# Patient Record
Sex: Male | Born: 1945 | Race: White | Hispanic: No | Marital: Married | Smoking: Never smoker
Health system: Southern US, Community
[De-identification: ages and names within clinical notes are randomized; demographics above are authoritative.]

## PROBLEM LIST (undated history)

## (undated) DIAGNOSIS — E785 Hyperlipidemia, unspecified: Secondary | ICD-10-CM

## (undated) DIAGNOSIS — M542 Cervicalgia: Secondary | ICD-10-CM

## (undated) DIAGNOSIS — I10 Essential (primary) hypertension: Secondary | ICD-10-CM

## (undated) DIAGNOSIS — F32A Depression, unspecified: Secondary | ICD-10-CM

## (undated) DIAGNOSIS — F419 Anxiety disorder, unspecified: Secondary | ICD-10-CM

## (undated) DIAGNOSIS — N400 Enlarged prostate without lower urinary tract symptoms: Secondary | ICD-10-CM

## (undated) DIAGNOSIS — F329 Major depressive disorder, single episode, unspecified: Secondary | ICD-10-CM

## (undated) DIAGNOSIS — G709 Myoneural disorder, unspecified: Secondary | ICD-10-CM

## (undated) DIAGNOSIS — G8929 Other chronic pain: Secondary | ICD-10-CM

## (undated) HISTORY — DX: Cervicalgia: M54.2

## (undated) HISTORY — PX: HERNIA REPAIR: SHX51

## (undated) HISTORY — PX: SPINE SURGERY: SHX786

## (undated) HISTORY — DX: Major depressive disorder, single episode, unspecified: F32.9

## (undated) HISTORY — DX: Hyperlipidemia, unspecified: E78.5

## (undated) HISTORY — DX: Depression, unspecified: F32.A

## (undated) HISTORY — DX: Anxiety disorder, unspecified: F41.9

## (undated) HISTORY — DX: Myoneural disorder, unspecified: G70.9

## (undated) HISTORY — DX: Benign prostatic hyperplasia without lower urinary tract symptoms: N40.0

## (undated) HISTORY — DX: Essential (primary) hypertension: I10

## (undated) HISTORY — DX: Other chronic pain: G89.29

---

## 2003-03-02 ENCOUNTER — Ambulatory Visit (HOSPITAL_COMMUNITY): Admission: RE | Admit: 2003-03-02 | Discharge: 2003-03-02 | Payer: Self-pay | Admitting: Gastroenterology

## 2005-10-19 ENCOUNTER — Emergency Department: Payer: Self-pay | Admitting: Emergency Medicine

## 2005-10-23 ENCOUNTER — Ambulatory Visit: Payer: Self-pay | Admitting: Specialist

## 2005-11-04 ENCOUNTER — Inpatient Hospital Stay (HOSPITAL_COMMUNITY): Admission: RE | Admit: 2005-11-04 | Discharge: 2005-11-10 | Payer: Self-pay | Admitting: Neurosurgery

## 2008-08-20 ENCOUNTER — Encounter: Admission: RE | Admit: 2008-08-20 | Discharge: 2008-08-20 | Payer: Self-pay | Admitting: Family Medicine

## 2009-12-11 ENCOUNTER — Ambulatory Visit: Payer: Self-pay | Admitting: Surgery

## 2009-12-18 ENCOUNTER — Ambulatory Visit: Payer: Self-pay | Admitting: Surgery

## 2010-08-28 ENCOUNTER — Encounter: Admission: RE | Admit: 2010-08-28 | Discharge: 2010-08-28 | Payer: Self-pay | Admitting: Family Medicine

## 2010-09-30 ENCOUNTER — Encounter: Admission: RE | Admit: 2010-09-30 | Discharge: 2010-09-30 | Payer: Self-pay | Admitting: Neurosurgery

## 2010-11-05 ENCOUNTER — Other Ambulatory Visit: Payer: Self-pay | Admitting: Internal Medicine

## 2010-11-11 ENCOUNTER — Ambulatory Visit: Payer: Self-pay | Admitting: Pain Medicine

## 2010-11-26 ENCOUNTER — Ambulatory Visit: Payer: Self-pay | Admitting: Pain Medicine

## 2010-12-02 ENCOUNTER — Ambulatory Visit: Payer: Self-pay | Admitting: Pain Medicine

## 2010-12-16 ENCOUNTER — Ambulatory Visit: Payer: Self-pay | Admitting: Pain Medicine

## 2010-12-29 ENCOUNTER — Ambulatory Visit: Payer: Self-pay | Admitting: Pain Medicine

## 2011-02-11 ENCOUNTER — Other Ambulatory Visit: Payer: Self-pay | Admitting: Internal Medicine

## 2011-03-20 NOTE — Op Note (Signed)
Austin Rodriguez, KARGE               ACCOUNT NO.:  [id number]   MEDICAL RECORD NO.:  [id number]          PATIENT TYPE:  INP   LOCATION:  2899                         FACILITY:  [facility]   PHYSICIAN:  [name], M.D.   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   PROCEDURE:  Bilateral L3-L4 laminectomy, foraminotomy, removal of synovial  cyst at the level of L3-L4, left.   SURGEON:  [name], M.D.   ASSISTANT:  Clydene Fake, M.D.   CLINICAL HISTORY:  The patient was admitted because of back pain with  radiation to both legs.  He has severe stenosis at the level of L3-L4 and L4-  L5.  The patient has quite a bit of weakness in both legs with difficulty  urinating up to the point he had to sit to void.  X-rays showed severe  stenosis at L3-L4 and L4-L5.  Surgery was advised and the risks were  explained during the history and physical.   DESCRIPTION OF PROCEDURE:  The patient was taken to the OR and after  intubation, he was positioned in a prone manner.  The back was prepped with  Betadine.  A midline incision from L2-L3 was made all the way down to L5-S1.  The muscles were retracted laterally.  X-ray showed that, indeed, the upper  clamp was at the level of L3.  Then, with the Stille rongeur, we removed the  spinous process of L3, L4, and L5.  With the drill, we did the laminectomy  to decompress and remove the laminae of L3, L4, and L5.  Indeed, the patient  had quite a bit of thickening of the yellow ligament and foraminotomy was  accomplished.  We went laterally preserving the facet.  At the level of L3-  L4 to the left, the patient has quite a bit of large synovial cysts which  were adherent to the  dural matter.  Removal was accomplished.  There was a  small arachnoid pouch which was repaired with Prolene as well as Tisseel.  Having done this, the area was irrigated.  We investigated, again, the  foramen and they were open.  Valsalva maneuver was negative.  From then on,  the wound was closed with Vicryl and Steri-Strips.           ______________________________  [name], M.D.     EB/MEDQ  D:  11/04/2005  T:  11/04/2005  Job:  [id number]

## 2011-03-20 NOTE — Consult Note (Signed)
NAMEBOHDI, LEEDS               ACCOUNT NO.:  [id number]   MEDICAL RECORD NO.:  [id number]          PATIENT TYPE:  INP   LOCATION:  3034                         FACILITY:  [facility]   PHYSICIAN:  Sigmund I. Patsi Sears, M.D.DATE OF BIRTH:  February 11, 1946   DATE OF CONSULTATION:  11/09/2005  DATE OF DISCHARGE:                                   CONSULTATION   The time is 2000 hours.   SUBJECTIVE:  Austin Rodriguez is a 65 year old married, white male, status post  spinal stenosis surgery with lumbar laminectomy L3-L4, foraminotomy, removal  of synovial cyst at L3-L4, on November 04, 2005.  The patient has had urinary  retention since this surgery.   PAST MEDICAL HISTORY:  Significant for two year history of lower urinary  tract symptoms (blood tinged) with slow urinary stream, and urinary  intermittency.  He denies nocturia, frequency or urgency.   His past history is significant for left inguinal hernia, with history of  urinary retention until hernia reduced.   PAST MEDICAL HISTORY:  <ROS:  Noncontributory.  There is no history of heart disease, diabetes, COPD.  Urologic ZOX:WRUEAVW retention with hernia repair. Otherwise neg.  <FAMILY HISTORY: No history urinary problems.   HOSPITAL COURSE:  The patient has had a Foley catheter removed 3 different  times, each time with urinary retention.  Today, the patient had voided well  in the morning, but then had 500 cc residual this afternoon.  A Foley  catheter was re-inserted with 700 cc removed.   PHYSICAL EXAMINATION:  GENERAL:  Today shows a well-developed white male in  no acute distress.  VITAL SIGNS:  Blood pressure today is 120/73.  Temperature 97.3.  Pulse is  84.  CHEST: Clear to P and A.  COR: S1S2 nml.  ABDOMEN:  Soft, scaphoid, plus bowel sounds without organomegaly, without  masses.  GENITOURINARY:  Foley catheter placed.  Penis is normal.  Urethra is normal.  The glans are normal.  The testicles are descended bilaterally and  measures  4 x 4 cm.  RECTAL:  Shows decrease in sphincter tone.  Prostate is 3+, nodules benign.  There is left inguinal hernia present.   ASSESSMENT:  Urinary retention, recurrent following back surgery with 2 year  history of bladder outlet obstruction symptoms.   PLAN:  Double the Flomax to 0.4 mg b.i.d. as well as Urecholine 27 mg b.i.d.  We will begin that tonight.  Tomorrow morning, we will remove Foley catheter  at 6 a.m., give voiding trial with PPR tomorrow.  Anticipate that the  patient will be able to recover voiding function.      Sigmund I. Patsi Sears, M.D.  Electronically Signed     SIT/MEDQ  D:  11/09/2005  T:  11/10/2005  Job:  [id number]

## 2011-03-20 NOTE — Discharge Summary (Signed)
NAMEDERRIEN, ANSCHUTZ               ACCOUNT NO.:  [id number]   MEDICAL RECORD NO.:  [id number]          PATIENT TYPE:  INP   LOCATION:  3034                         FACILITY:  [facility]   PHYSICIAN:  [name], M.D.   DATE OF BIRTH:  Jul 24, 1946   DATE OF ADMISSION:  11/04/2005  DATE OF DISCHARGE:  11/10/2005                                 DISCHARGE SUMMARY   ADMISSION DIAGNOSIS:  Lumbar stenosis with degenerative disk disease.   FINAL DIAGNOSIS:  Lumbar stenosis with degenerative disk disease.   CLINICAL HISTORY:  The patient was admitted because of back pain that  radiates to both legs.  X-rays showed degenerative disk disease in the  lumbar spine.  Surgery was advised.   LABORATORY:  Normal.   COURSE IN THE HOSPITAL:  The patient was taken to surgery and a bilateral 3,  4 and 5 laminectomy, and foraminotomy was accomplished.  A large synovial  cyst was also removed from the left L3-L4.  After surgery, the patient  developed urine retention.  He was seen by Dr. Patsi Sears.  He was started  on Flomax.  On the night after surgery, he was feeling better and he was  discharged.   CONDITION ON DISCHARGE:  Improvement.   MEDICATIONS:  Percocet and lorazepam, Flomax and Urecholine.   DIET:  Regular.   ACTIVITY:  Not to drive for at least a week.   FOLLOW UP:  He will be seen by Dr. Patsi Sears, as well by myself in the  office in 4 weeks.           ______________________________  [name], M.D.     EB/MEDQ  D:  04/06/2006  T:  04/06/2006  Job:  [id number]

## 2011-03-20 NOTE — Op Note (Signed)
   NAME:  Austin Rodriguez, Austin Rodriguez                         ACCOUNT NO.:  [id number]   MEDICAL RECORD NO.:  [id number]                   PATIENT TYPE:  AMB   LOCATION:  ENDO                                 FACILITY:  [facility]   PHYSICIAN:  Anselmo Rod, M.D.               DATE OF BIRTH:  1946/02/22   DATE OF PROCEDURE:  03/02/2003  DATE OF DISCHARGE:                                 OPERATIVE REPORT   PROCEDURE PERFORMED:  Screening colonoscopy.   ENDOSCOPIST:  Charna Elizabeth, M.D.   INSTRUMENT USED:  Olympus video colonoscope.   INDICATIONS FOR PROCEDURE:  The patient is a 65 year old white male  undergoing screening colonoscopy.  Rule out colonic polyps, masses, etc.   PREPROCEDURE PREPARATION:  Informed consent was procured from the patient.  The patient was fasted for eight hours prior to the procedure and prepped  with a bottle of magnesium citrate and a gallon of GoLYTELY the night prior  to the procedure.   PREPROCEDURE PHYSICAL:  The patient had stable vital signs.  Neck supple.  Chest clear to auscultation.  S1 and S2 regular.  Abdomen soft with normal  bowel sounds.   DESCRIPTION OF PROCEDURE:  The patient was placed in left lateral decubitus  position and sedated with 60 mg of Demerol and 6 mg of Versed intravenously.  Once the patient was adequately sedated and maintained on low flow oxygen  and continuous cardiac monitoring, the Olympus video colonoscope was  advanced from the rectum to the cecum without difficulty.  The prep was  adequate, no masses polyps, erosions, ulcerations or diverticula were seen.  The appendicular orifice and ileocecal valve were clearly visualized and  photographed.  On withdrawal of the scope, small internal hemorrhoids were  seen on retroflexion and small external hemorrhoids were seen on anal  inspection.  The patient tolerated the procedure well without complication.   IMPRESSION:  Normal colonoscopy except for small nonbleeding internal and  external hemorrhoids.                   RECOMMENDATIONS:  1. A high fiber diet with liberal fluid intake has been advocated.  2. Repeat colorectal cancer screening is recommended in the next 10 years     unless the patient develops any abnormal symptoms in the interim.  3. Outpatient follow-up on a p.r.n. basis.                                                  Anselmo Rod, M.D.  JNM/MEDQ  D:  03/02/2003  T:  03/02/2003  Job:  [id number]   cc:   Talmadge Coventry, M.D.  [address], [address]  [place]  [place] [address]  Fax: [phone]

## 2011-05-04 ENCOUNTER — Other Ambulatory Visit: Payer: Self-pay | Admitting: Internal Medicine

## 2011-07-09 ENCOUNTER — Other Ambulatory Visit: Payer: Self-pay | Admitting: Internal Medicine

## 2011-07-10 MED ORDER — FEXOFENADINE HCL 180 MG PO TABS: 180.0000 mg | ORAL_TABLET | Freq: Every day | ORAL | Status: DC

## 2011-07-30 ENCOUNTER — Other Ambulatory Visit: Payer: Self-pay | Admitting: Internal Medicine

## 2011-08-03 ENCOUNTER — Other Ambulatory Visit: Payer: Self-pay | Admitting: Internal Medicine

## 2011-08-03 MED ORDER — SIMVASTATIN 20 MG PO TABS: 20.0000 mg | ORAL_TABLET | Freq: Every evening | ORAL | Status: DC

## 2011-08-10 ENCOUNTER — Other Ambulatory Visit: Payer: Self-pay | Admitting: Internal Medicine

## 2011-08-10 NOTE — Telephone Encounter (Signed)
Patient needs a refill on his percocet..

## 2011-08-11 ENCOUNTER — Other Ambulatory Visit: Payer: Self-pay | Admitting: Internal Medicine

## 2011-08-11 MED ORDER — OXYCODONE-ACETAMINOPHEN 10-325 MG PO TABS: 1.0000 | ORAL_TABLET | Freq: Four times a day (QID) | ORAL | Status: AC | PRN

## 2011-08-11 MED ORDER — OXYCODONE-ACETAMINOPHEN 10-325 MG PO TABS: 1.0000 | ORAL_TABLET | Freq: Four times a day (QID) | ORAL | Status: DC | PRN

## 2011-08-13 ENCOUNTER — Other Ambulatory Visit: Payer: Self-pay | Admitting: Internal Medicine

## 2011-08-31 ENCOUNTER — Encounter: Payer: Self-pay | Admitting: Internal Medicine

## 2011-08-31 ENCOUNTER — Ambulatory Visit (INDEPENDENT_AMBULATORY_CARE_PROVIDER_SITE_OTHER): Payer: 59 | Admitting: Internal Medicine

## 2011-08-31 DIAGNOSIS — M5440 Lumbago with sciatica, unspecified side: Secondary | ICD-10-CM | POA: Insufficient documentation

## 2011-08-31 DIAGNOSIS — Z125 Encounter for screening for malignant neoplasm of prostate: Secondary | ICD-10-CM | POA: Insufficient documentation

## 2011-08-31 DIAGNOSIS — M5126 Other intervertebral disc displacement, lumbar region: Secondary | ICD-10-CM

## 2011-08-31 DIAGNOSIS — K635 Polyp of colon: Secondary | ICD-10-CM | POA: Insufficient documentation

## 2011-08-31 DIAGNOSIS — E119 Type 2 diabetes mellitus without complications: Secondary | ICD-10-CM | POA: Insufficient documentation

## 2011-08-31 DIAGNOSIS — M542 Cervicalgia: Secondary | ICD-10-CM | POA: Insufficient documentation

## 2011-08-31 DIAGNOSIS — Z1211 Encounter for screening for malignant neoplasm of colon: Secondary | ICD-10-CM

## 2011-08-31 MED ORDER — OXYCODONE-ACETAMINOPHEN 10-325 MG PO TABS: 1.0000 | ORAL_TABLET | Freq: Four times a day (QID) | ORAL | Status: DC | PRN

## 2011-08-31 NOTE — Progress Notes (Signed)
Subjective:    Patient ID: Austin Rodriguez, male    DOB: 03/10/1946, 65 y.o.   MRN: [id number]  HPI  65 yo white male with history of diabetes mellitus, cervical stenosis managed with neurontin and percocet , is here for an annual exam. He has no new complaints.  His pain is well managed with percocet and neurontin.  He has numbness in two fingers of his right hand but no weakness.  His blood sugars are well controlled .  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   Past Medical History  Diagnosis Date  . Diabetes mellitus   . cervical spondylosis     Current Outpatient Prescriptions on File Prior to Visit  Medication Sig Dispense Refill  . CYMBALTA 60 MG capsule TAKE ONE CAPSULE EVERY DAY  90 capsule  0  . fexofenadine (ALLEGRA) 180 MG tablet Take 1 tablet (180 mg total) by mouth daily.  30 tablet  3  . simvastatin (ZOCOR) 20 MG tablet Take 1 tablet (20 mg total) by mouth every evening.  90 tablet  3     Review of Systems  Constitutional: Negative for fever, chills, diaphoresis, activity change, appetite change, fatigue and unexpected weight change.  HENT: Positive for sneezing. Negative for hearing loss, ear pain, nosebleeds, congestion, sore throat, facial swelling, rhinorrhea, drooling, mouth sores, trouble swallowing, neck pain, neck stiffness, dental problem, voice change, postnasal drip, sinus pressure, tinnitus and ear discharge.   Eyes: Negative for photophobia, pain, discharge, redness, itching and visual disturbance.  Respiratory: Negative for apnea, cough, choking, chest tightness, shortness of breath, wheezing and stridor.   Cardiovascular: Negative for chest pain, palpitations and leg swelling.  Gastrointestinal: Negative for nausea, vomiting, abdominal pain, diarrhea, constipation, blood in stool, abdominal distention, anal bleeding and rectal pain.  Genitourinary: Negative for dysuria, urgency, frequency, hematuria, flank pain, decreased urine volume,  scrotal swelling, difficulty urinating and testicular pain.  Musculoskeletal: Positive for back pain and arthralgias. Negative for myalgias, joint swelling and gait problem.  Skin: Negative for color change, rash and wound.  Neurological: Negative for dizziness, tremors, seizures, syncope, speech difficulty, weakness, light-headedness, numbness and headaches.  Psychiatric/Behavioral: Negative for suicidal ideas, hallucinations, behavioral problems, confusion, sleep disturbance, dysphoric mood, decreased concentration and agitation. The patient is not nervous/anxious.   All other systems reviewed and are negative.   BP 134/80  Pulse 78  Temp(Src) 97.8 F (36.6 C) (Oral)  Resp 16  Ht 6' (1.829 m)  Wt 240 lb 8 oz (109.09 kg)  BMI 32.62 kg/m2  SpO2 97%     Objective:   Physical Exam  Constitutional: He is oriented to person, place, and time.  HENT:  Head: Normocephalic and atraumatic.  Mouth/Throat: Oropharynx is clear and moist.  Eyes: Conjunctivae and EOM are normal.  Neck: Normal range of motion. Neck supple. No JVD present. No thyromegaly present.  Cardiovascular: Normal rate, regular rhythm and normal heart sounds.   Pulmonary/Chest: Effort normal and breath sounds normal. He has no wheezes. He has no rales.  Abdominal: Soft. Bowel sounds are normal. He exhibits no mass. There is no tenderness. There is no rebound.  Genitourinary: Rectum normal, prostate normal and penis normal.  Musculoskeletal: Normal range of motion. He exhibits no edema.  Neurological: He is alert and oriented to person, place, and time.  Skin: Skin is warm and dry.  Psychiatric: He has a normal mood and affect. His behavior is normal. Thought content normal.  Assessment & Plan:  Annual male exam:  Prostate and testicular exam were normal.  PSA was done last week at [facility] for prostate cancer screening.  DM: historically well controlled, repeat A1c is due.   Hyperlipidemia: managed with  statin.  LDFL gos is 70,  Repeat liids due.  Colon Cancer screening:  prior colonoscopy was done by Charna Elizabeth and normal.

## 2011-08-31 NOTE — Patient Instructions (Addendum)
We will check your fasting labs next week  For constipation, use Dulcolax or Sennot S

## 2011-09-02 ENCOUNTER — Other Ambulatory Visit: Payer: Self-pay | Admitting: Internal Medicine

## 2011-09-04 MED ORDER — ALPRAZOLAM 0.5 MG PO TABS: 0.5000 mg | ORAL_TABLET | Freq: Every day | ORAL | Status: DC | PRN

## 2011-09-21 ENCOUNTER — Telehealth: Payer: Self-pay | Admitting: Internal Medicine

## 2011-09-21 NOTE — Telephone Encounter (Signed)
Notified patient of results 

## 2011-09-21 NOTE — Telephone Encounter (Signed)
His PSA from early October was normal at 0.8

## 2011-09-22 ENCOUNTER — Other Ambulatory Visit: Payer: Self-pay | Admitting: Internal Medicine

## 2011-10-05 ENCOUNTER — Telehealth: Payer: Self-pay | Admitting: Internal Medicine

## 2011-10-05 NOTE — Telephone Encounter (Signed)
He needs a CMET, UA and HgbA1c.  Can he come by here today to get these drawn?

## 2011-10-05 NOTE — Telephone Encounter (Signed)
Patients wife called and stated he just had labs done last week at the hospital.  I will request labs from hospital.

## 2011-10-05 NOTE — Telephone Encounter (Signed)
Patients wife called and stated patients blood sugars have been high.  Last night they were 578 and checked it again and it was 575.  She stated last night he had not eat anything since lunch that day, except for some popcorn.  She said this morning fasting it was 274.  He is not on any medication or insulin, she said he has been controlling it with diet.  She said he has lost weight over the past few weeks.  He has an appt for next week but I moved it up to tomorrow.  She wanted to know if there is anything that needs be done before the appt tomorrow.  Please advise.

## 2011-10-06 ENCOUNTER — Encounter: Payer: Self-pay | Admitting: Internal Medicine

## 2011-10-06 ENCOUNTER — Ambulatory Visit (INDEPENDENT_AMBULATORY_CARE_PROVIDER_SITE_OTHER): Payer: 59 | Admitting: Internal Medicine

## 2011-10-06 DIAGNOSIS — E119 Type 2 diabetes mellitus without complications: Secondary | ICD-10-CM

## 2011-10-06 DIAGNOSIS — E1165 Type 2 diabetes mellitus with hyperglycemia: Secondary | ICD-10-CM

## 2011-10-06 DIAGNOSIS — R350 Frequency of micturition: Secondary | ICD-10-CM

## 2011-10-06 LAB — POCT URINALYSIS DIPSTICK
Glucose, UA: 100
Leukocytes, UA: NEGATIVE
Spec Grav, UA: 1.025
Urobilinogen, UA: 1

## 2011-10-06 MED ORDER — GLIPIZIDE 5 MG PO TABS: 5.0000 mg | ORAL_TABLET | Freq: Two times a day (BID) | ORAL | Status: DC

## 2011-10-06 MED ORDER — METFORMIN HCL 500 MG PO TABS: 500.0000 mg | ORAL_TABLET | Freq: Two times a day (BID) | ORAL | Status: DC

## 2011-10-06 NOTE — Progress Notes (Signed)
Subjective:    Patient ID: Austin Rodriguez, male    DOB: 04/14/1946, 65 y.o.   MRN: [id number]  HPI  65 yo diet controlled dm, last a1c 6.2 in October,  Presents with one week history of malaise, dipahoresis, with recent dietary excursions due to the holidays.   Elevated blood sugar of 574 last Sunday,   Polyuria,  10 lb wt loss, noted since last visit 5 weeks ago.  cbgs for the past 3 days 274 fasting 289 post prandial.  Now on low cab diet.  No new meds and no herbal meds  .  Current Outpatient Prescriptions on File Prior to Visit  Medication Sig Dispense Refill  . acetaminophen (TYLENOL) 500 MG tablet Take 500 mg by mouth every 6 (six) hours as needed.        . ALPRAZolam (XANAX) 0.5 MG tablet Take 1 tablet (0.5 mg total) by mouth daily as needed for sleep or anxiety.  30 tablet  3  . aspirin 81 MG tablet Take 81 mg by mouth daily.        . cholecalciferol (VITAMIN D) 1000 UNITS tablet Take 1,000 Units by mouth daily.        . CYMBALTA 60 MG capsule TAKE ONE CAPSULE EVERY DAY  90 capsule  0  . fexofenadine (ALLEGRA) 180 MG tablet Take 1 tablet (180 mg total) by mouth daily.  30 tablet  3  . fish oil-omega-3 fatty acids 1000 MG capsule Take 2 g by mouth daily.        . fluticasone (FLONASE) 50 MCG/ACT nasal spray Place 2 sprays into the nose daily.        Marland Kitchen gabapentin (NEURONTIN) 300 MG capsule Take 600 mg by mouth 3 (three) times daily.        Marland Kitchen losartan-hydrochlorothiazide (HYZAAR) 100-12.5 MG per tablet Take 1 tablet by mouth daily.        . Multiple Vitamin (MULTIVITAMIN) tablet Take 1 tablet by mouth daily.        Marland Kitchen oxyCODONE-acetaminophen (PERCOCET) 10-325 MG per tablet Take 1 tablet by mouth every 6 (six) hours as needed.  120 tablet  0  . simvastatin (ZOCOR) 20 MG tablet Take 1 tablet (20 mg total) by mouth every evening.  90 tablet  3   Past Medical History  Diagnosis Date  . Diabetes mellitus   . cervical spondylosis        Review of Systems  Constitutional: Negative for  fever, chills, diaphoresis, activity change, appetite change, fatigue and unexpected weight change.  HENT: Positive for sneezing. Negative for hearing loss, ear pain, nosebleeds, congestion, sore throat, facial swelling, rhinorrhea, drooling, mouth sores, trouble swallowing, neck pain, neck stiffness, dental problem, voice change, postnasal drip, sinus pressure, tinnitus and ear discharge.   Eyes: Negative for photophobia, pain, discharge, redness, itching and visual disturbance.  Respiratory: Negative for apnea, cough, choking, chest tightness, shortness of breath, wheezing and stridor.   Cardiovascular: Negative for chest pain, palpitations and leg swelling.  Gastrointestinal: Negative for nausea, vomiting, abdominal pain, diarrhea, constipation, blood in stool, abdominal distention, anal bleeding and rectal pain.  Genitourinary: Negative for dysuria, urgency, frequency, hematuria, flank pain, decreased urine volume, scrotal swelling, difficulty urinating and testicular pain.  Musculoskeletal: Positive for back pain and arthralgias. Negative for myalgias, joint swelling and gait problem.  Skin: Negative for color change, rash and wound.  Neurological: Negative for dizziness, tremors, seizures, syncope, speech difficulty, weakness, light-headedness, numbness and headaches.  Psychiatric/Behavioral: Negative for suicidal ideas, hallucinations,  behavioral problems, confusion, sleep disturbance, dysphoric mood, decreased concentration and agitation. The patient is not nervous/anxious.   All other systems reviewed and are negative.       Objective:   Physical Exam  Constitutional: He is oriented to person, place, and time.  HENT:  Head: Normocephalic and atraumatic.  Mouth/Throat: Oropharynx is clear and moist.  Eyes: Conjunctivae and EOM are normal.  Neck: Normal range of motion. Neck supple. No JVD present. No thyromegaly present.  Cardiovascular: Normal rate, regular rhythm and normal heart  sounds.   Pulmonary/Chest: Effort normal and breath sounds normal. He has no wheezes. He has no rales.  Abdominal: Soft. Bowel sounds are normal. He exhibits no mass. There is no tenderness. There is no rebound.  Genitourinary: Rectum normal, prostate normal and penis normal.  Musculoskeletal: Normal range of motion. He exhibits no edema.  Neurological: He is alert and oriented to person, place, and time.  Skin: Skin is warm and dry.  Psychiatric: He has a normal mood and affect. His behavior is normal. Thought content normal.          Assessment & Plan:   Diabetes mellitus His last a1c as 6.2 less than 3 months ago and he has had sudden onset of hyperglycemia.  Starting insulin , metformin and glipizide.  counselling given.    Updated Medication List Outpatient Encounter Prescriptions as of 10/06/2011  Medication Sig Dispense Refill  . acetaminophen (TYLENOL) 500 MG tablet Take 500 mg by mouth every 6 (six) hours as needed.        . ALPRAZolam (XANAX) 0.5 MG tablet Take 1 tablet (0.5 mg total) by mouth daily as needed for sleep or anxiety.  30 tablet  3  . aspirin 81 MG tablet Take 81 mg by mouth daily.        . cholecalciferol (VITAMIN D) 1000 UNITS tablet Take 1,000 Units by mouth daily.        . CYMBALTA 60 MG capsule TAKE ONE CAPSULE EVERY DAY  90 capsule  0  . fexofenadine (ALLEGRA) 180 MG tablet Take 1 tablet (180 mg total) by mouth daily.  30 tablet  3  . fish oil-omega-3 fatty acids 1000 MG capsule Take 2 g by mouth daily.        . fluticasone (FLONASE) 50 MCG/ACT nasal spray Place 2 sprays into the nose daily.        Marland Kitchen gabapentin (NEURONTIN) 300 MG capsule Take 600 mg by mouth 3 (three) times daily.        Marland Kitchen losartan-hydrochlorothiazide (HYZAAR) 100-12.5 MG per tablet Take 1 tablet by mouth daily.        . Multiple Vitamin (MULTIVITAMIN) tablet Take 1 tablet by mouth daily.        Marland Kitchen oxyCODONE-acetaminophen (PERCOCET) 10-325 MG per tablet Take 1 tablet by mouth every 6  (six) hours as needed.  120 tablet  0  . simvastatin (ZOCOR) 20 MG tablet Take 1 tablet (20 mg total) by mouth every evening.  90 tablet  3  . glipiZIDE (GLUCOTROL) 5 MG tablet Take 1 tablet (5 mg total) by mouth 2 (two) times daily before a meal.  30 tablet  11  . metFORMIN (GLUCOPHAGE) 500 MG tablet Take 1 tablet (500 mg total) by mouth 2 (two) times daily with a meal.  60 tablet  11  . DISCONTD: oxyCODONE-acetaminophen (PERCOCET) 10-325 MG per tablet Take 1 tablet by mouth every 6 (six) hours as needed.  120 tablet  0

## 2011-10-06 NOTE — Patient Instructions (Signed)
Start with 15 units of Levemir insulin daily. Decrease or increase by 3 units every 3 days until fasting sugars reach 150.  Give in Am or at night .  We are also starting metformin 500 mg twice daily  And glipizide 5 mg twice daily . Take both before breakfast and dinner.

## 2011-10-07 ENCOUNTER — Encounter: Payer: Self-pay | Admitting: Internal Medicine

## 2011-10-07 NOTE — Assessment & Plan Note (Signed)
His last a1c as 6.2 less than 3 months ago and he has had sudden onset of hyperglycemia.  Starting insulin , metformin and glipizide.  counselling given.

## 2011-10-09 LAB — CULTURE, URINE COMPREHENSIVE: Colony Count: NO GROWTH

## 2011-10-13 ENCOUNTER — Ambulatory Visit: Payer: 59 | Admitting: Internal Medicine

## 2011-10-13 ENCOUNTER — Other Ambulatory Visit: Payer: Self-pay | Admitting: Internal Medicine

## 2011-10-13 DIAGNOSIS — M542 Cervicalgia: Secondary | ICD-10-CM

## 2011-10-14 ENCOUNTER — Telehealth: Payer: Self-pay | Admitting: Internal Medicine

## 2011-10-14 MED ORDER — OXYCODONE-ACETAMINOPHEN 10-325 MG PO TABS: 1.0000 | ORAL_TABLET | Freq: Four times a day (QID) | ORAL | Status: DC | PRN

## 2011-10-14 MED ORDER — BLOOD GLUCOSE METER KIT: PACK | Status: DC

## 2011-10-14 MED ORDER — GLUCOSE BLOOD VI STRP: ORAL_STRIP | Status: DC

## 2011-10-14 MED ORDER — LANCETS MISC: Status: AC

## 2011-10-14 NOTE — Telephone Encounter (Signed)
His blood sugarsare steadily improving and on the last day he submitted were perfect. He may start to drop so if his bs goes below 100,  Decrease the Levemir dose to 7 units daily.

## 2011-10-14 NOTE — Telephone Encounter (Signed)
Oxycodone refill printed

## 2011-10-15 ENCOUNTER — Telehealth: Payer: Self-pay | Admitting: Internal Medicine

## 2011-10-15 MED ORDER — BLOOD GLUCOSE METER KIT: PACK | Status: AC

## 2011-10-15 MED ORDER — GLUCOSE BLOOD VI STRP
ORAL_STRIP | Status: AC
Start: 2011-10-15 — End: 2012-10-14

## 2011-10-15 NOTE — Telephone Encounter (Signed)
This has already been done. I called and checked with the pharmacy and they said that they do have it.

## 2011-10-15 NOTE — Telephone Encounter (Signed)
This has already been doe

## 2011-10-15 NOTE — Telephone Encounter (Signed)
[phone] Pt wife called checking on mr Boberg's rx for test strips and needle.  Ms Warnell said this was the 2nd request  Please advise pt when this is called.  She left message on voice mail didn't give which pharmacy she wanted this sent to

## 2011-10-15 NOTE — Telephone Encounter (Signed)
Patient notified of results.

## 2011-10-15 NOTE — Telephone Encounter (Signed)
Left message asking patient to return my call.

## 2011-10-22 ENCOUNTER — Telehealth: Payer: Self-pay | Admitting: Internal Medicine

## 2011-10-22 NOTE — Telephone Encounter (Signed)
[phone] Pt called his blood sugar is 50.  Not taking shot for 2 days still taking metform 2 days with meals Spoke with dr Darrick Huntsman he is to drink orange juice pt stated he feels fine little light  Dr Darrick Huntsman spoke with pt  Pt to take his blood sugar every 30 min

## 2011-10-23 ENCOUNTER — Encounter: Payer: Self-pay | Admitting: Internal Medicine

## 2011-11-06 ENCOUNTER — Ambulatory Visit: Payer: 59 | Admitting: Internal Medicine

## 2011-11-09 ENCOUNTER — Other Ambulatory Visit: Payer: Self-pay | Admitting: *Deleted

## 2011-11-09 MED ORDER — GABAPENTIN 300 MG PO CAPS: 600.0000 mg | ORAL_CAPSULE | Freq: Three times a day (TID) | ORAL | Status: DC

## 2011-11-09 NOTE — Telephone Encounter (Signed)
Faxed request from [facility] [address]  Last filled 08/02/11.

## 2011-11-12 ENCOUNTER — Encounter: Payer: Self-pay | Admitting: Internal Medicine

## 2011-11-12 ENCOUNTER — Ambulatory Visit (INDEPENDENT_AMBULATORY_CARE_PROVIDER_SITE_OTHER): Payer: 59 | Admitting: Internal Medicine

## 2011-11-12 VITALS — BP 150/78 | Temp 98.6°F | Wt 231.0 lb

## 2011-11-12 DIAGNOSIS — E1165 Type 2 diabetes mellitus with hyperglycemia: Secondary | ICD-10-CM

## 2011-11-12 DIAGNOSIS — M549 Dorsalgia, unspecified: Secondary | ICD-10-CM

## 2011-11-12 DIAGNOSIS — M545 Low back pain: Secondary | ICD-10-CM

## 2011-11-12 DIAGNOSIS — M5440 Lumbago with sciatica, unspecified side: Secondary | ICD-10-CM

## 2011-11-12 DIAGNOSIS — M5126 Other intervertebral disc displacement, lumbar region: Secondary | ICD-10-CM

## 2011-11-12 DIAGNOSIS — E119 Type 2 diabetes mellitus without complications: Secondary | ICD-10-CM

## 2011-11-12 DIAGNOSIS — M5430 Sciatica, unspecified side: Secondary | ICD-10-CM

## 2011-11-12 DIAGNOSIS — M542 Cervicalgia: Secondary | ICD-10-CM

## 2011-11-12 MED ORDER — GLIPIZIDE 5 MG PO TABS: 5.0000 mg | ORAL_TABLET | Freq: Two times a day (BID) | ORAL | Status: DC

## 2011-11-12 MED ORDER — OXYCODONE-ACETAMINOPHEN 10-325 MG PO TABS: 1.0000 | ORAL_TABLET | Freq: Four times a day (QID) | ORAL | Status: DC | PRN

## 2011-11-12 MED ORDER — LOSARTAN POTASSIUM-HCTZ 100-12.5 MG PO TABS: 1.0000 | ORAL_TABLET | Freq: Every day | ORAL | Status: DC

## 2011-11-12 MED ORDER — DULOXETINE HCL 60 MG PO CPEP: 60.0000 mg | ORAL_CAPSULE | Freq: Every day | ORAL | Status: DC

## 2011-11-12 MED ORDER — OXYCODONE HCL 20 MG PO TB12: 20.0000 mg | ORAL_TABLET | Freq: Two times a day (BID) | ORAL | Status: AC

## 2011-11-12 NOTE — Progress Notes (Signed)
  Subjective:    Patient ID: Austin Rodriguez, male    DOB: 1946-11-01, 66 y.o.   MRN: [id number]  HPI   Austin Rodriguez is a 66 yr old white male with chronic neck and arm pain due to cervical disk disease who presents with new onset mid thoracic pain radiating to his  chest bilaterally which is aggravated by reaching overhead and bending over.  He has also developed low back pain radiating to both hips and legs to the knee but not below.  His pain is mitigated with use of percocet every 5 hours but not adequately controlled. He has been having occasional constipation but moves his bowels once daily .  He denies any recent trips or engaging in any unusual activities that may have precipitated his new pain complaints.   Past Medical History  Diagnosis Date  . cervical spondylosis   . Diabetes mellitus   . Chronic neck pain   . Hyperlipidemia   . Hypertension   . Depression   . Anxiety     Review of Systems  Constitutional: Negative for fever, chills, diaphoresis, activity change, appetite change, fatigue and unexpected weight change.  HENT: Positive for sneezing. Negative for hearing loss, ear pain, nosebleeds, congestion, sore throat, facial swelling, rhinorrhea, drooling, mouth sores, trouble swallowing, neck pain, neck stiffness, dental problem, voice change, postnasal drip, sinus pressure, tinnitus and ear discharge.   Eyes: Negative for photophobia, pain, discharge, redness, itching and visual disturbance.  Respiratory: Negative for apnea, cough, choking, chest tightness, shortness of breath, wheezing and stridor.   Cardiovascular: Negative for chest pain, palpitations and leg swelling.  Gastrointestinal: Negative for nausea, vomiting, abdominal pain, diarrhea, constipation, blood in stool, abdominal distention, anal bleeding and rectal pain.  Genitourinary: Negative for dysuria, urgency, frequency, hematuria, flank pain, decreased urine volume, scrotal swelling, difficulty urinating and  testicular pain.  Musculoskeletal: Positive for back pain and arthralgias. Negative for myalgias, joint swelling and gait problem.  Skin: Negative for color change, rash and wound.  Neurological: Negative for dizziness, tremors, seizures, syncope, speech difficulty, weakness, light-headedness, numbness and headaches.  Psychiatric/Behavioral: Negative for suicidal ideas, hallucinations, behavioral problems, confusion, sleep disturbance, dysphoric mood, decreased concentration and agitation. The patient is not nervous/anxious.   All other systems reviewed and are negative.   BP 150/78  Temp(Src) 98.6 F (37 C) (Oral)  Wt 231 lb (104.781 kg)  SpO2 94%     Objective:   Physical Exam  Constitutional: He is oriented to person, place, and time.  HENT:  Head: Normocephalic and atraumatic.  Mouth/Throat: Oropharynx is clear and moist.  Eyes: Conjunctivae and EOM are normal.  Neck: Normal range of motion. Neck supple. No JVD present. No thyromegaly present.  Cardiovascular: Normal rate, regular rhythm and normal heart sounds.   Pulmonary/Chest: Effort normal and breath sounds normal. He has no wheezes. He has no rales.  Abdominal: Soft. Bowel sounds are normal. He exhibits no mass. There is no tenderness. There is no rebound.  Genitourinary: Rectum normal, prostate normal and penis normal.  Musculoskeletal: Normal range of motion. He exhibits no edema.  Neurological: He is alert and oriented to person, place, and time.  Skin: Skin is warm and dry.  Psychiatric: He has a normal mood and affect. His behavior is normal. Thought content normal.      Assessment & Plan:

## 2011-11-12 NOTE — Patient Instructions (Addendum)
Increase the glipizide to 5 mg before dinner. Continue the morning dose.  Increase the metformin to 1000 mg twice daily ; cut back to 1.5 tablets twice daily if it acuses prolonged diarrhea    We are adding oxycontin 20 mg every 12 hours to help your percocet last longer.  Take at 8 am and 8 pm.  Use the percocet for brreakthrough pain, up to 4 daily.

## 2011-11-15 ENCOUNTER — Encounter: Payer: Self-pay | Admitting: Internal Medicine

## 2011-11-15 DIAGNOSIS — G8929 Other chronic pain: Secondary | ICD-10-CM | POA: Insufficient documentation

## 2011-11-15 DIAGNOSIS — E1165 Type 2 diabetes mellitus with hyperglycemia: Secondary | ICD-10-CM | POA: Insufficient documentation

## 2011-11-15 DIAGNOSIS — M545 Low back pain, unspecified: Secondary | ICD-10-CM | POA: Insufficient documentation

## 2011-11-15 DIAGNOSIS — F329 Major depressive disorder, single episode, unspecified: Secondary | ICD-10-CM | POA: Insufficient documentation

## 2011-11-15 DIAGNOSIS — E785 Hyperlipidemia, unspecified: Secondary | ICD-10-CM | POA: Insufficient documentation

## 2011-11-15 DIAGNOSIS — I10 Essential (primary) hypertension: Secondary | ICD-10-CM | POA: Insufficient documentation

## 2011-11-15 DIAGNOSIS — F419 Anxiety disorder, unspecified: Secondary | ICD-10-CM | POA: Insufficient documentation

## 2011-11-15 NOTE — Assessment & Plan Note (Signed)
With prior back surgery by Dr. Jeral Fruit in 2007 with resolution of lower back issues until recently.  He will need to see Dr. Jeral Fruit again and likely have an repeat MRI.  Pain control addressed today .  Will add long acting oyxcontin twice daily to reduce his use of tylenol.

## 2011-11-15 NOTE — Assessment & Plan Note (Addendum)
His hgba1c jumped from 6.2 controlled with diet previously, to 8.9 in November and medications including  insulin were started at his last visit.  He stopped his insulin after the samples ran out and his sugars are still above goal by report.  Today we increased his glipizide and metformin. Will resume insulin in a few weeks if needed to achieve normoglycemia,  HgbA1c is due again in February

## 2011-11-15 NOTE — Assessment & Plan Note (Signed)
secondary to spinal stenosis,  Managed well with oxycdone and neurontin.  Has no feeiling in  right hand index and middle finger buyu denies tingling and no weakness.

## 2011-11-16 ENCOUNTER — Other Ambulatory Visit: Payer: Self-pay | Admitting: *Deleted

## 2011-11-16 MED ORDER — LOSARTAN POTASSIUM-HCTZ 100-12.5 MG PO TABS: 1.0000 | ORAL_TABLET | Freq: Every day | ORAL | Status: DC

## 2011-12-10 ENCOUNTER — Other Ambulatory Visit: Payer: Self-pay | Admitting: Internal Medicine

## 2011-12-10 DIAGNOSIS — M542 Cervicalgia: Secondary | ICD-10-CM

## 2011-12-10 NOTE — Telephone Encounter (Signed)
[phone] Pt called to get refill on oxycotin and oxycondone   Please call when ready too pick up

## 2011-12-11 ENCOUNTER — Telehealth: Payer: Self-pay | Admitting: Internal Medicine

## 2011-12-11 MED ORDER — OXYCODONE HCL 20 MG PO TB12: 20.0000 mg | ORAL_TABLET | Freq: Two times a day (BID) | ORAL | Status: DC

## 2011-12-11 MED ORDER — OXYCODONE-ACETAMINOPHEN 10-325 MG PO TABS: 1.0000 | ORAL_TABLET | Freq: Four times a day (QID) | ORAL | Status: DC | PRN

## 2011-12-11 NOTE — Telephone Encounter (Signed)
Note has been sent to Dr. Darrick Huntsman for approval.

## 2011-12-11 NOTE — Telephone Encounter (Signed)
Scripts placed up front for pick up, left message on machine advising pt that scripts are ready.

## 2011-12-11 NOTE — Telephone Encounter (Signed)
Patient needing refill on his oxycodone and OxyContin wants to come by today to pick up or he will run out over the weekend.

## 2011-12-28 ENCOUNTER — Other Ambulatory Visit: Payer: Self-pay | Admitting: Internal Medicine

## 2011-12-28 MED ORDER — ALPRAZOLAM 0.5 MG PO TABS: 0.5000 mg | ORAL_TABLET | Freq: Every day | ORAL | Status: DC | PRN

## 2012-01-01 HISTORY — PX: CARPAL TUNNEL RELEASE: SHX101

## 2012-01-07 ENCOUNTER — Other Ambulatory Visit: Payer: Self-pay | Admitting: Internal Medicine

## 2012-01-07 DIAGNOSIS — M542 Cervicalgia: Secondary | ICD-10-CM

## 2012-01-07 MED ORDER — FEXOFENADINE HCL 180 MG PO TABS: 180.0000 mg | ORAL_TABLET | Freq: Every day | ORAL | Status: AC

## 2012-01-07 MED ORDER — OXYCODONE HCL 20 MG PO TB12: 20.0000 mg | ORAL_TABLET | Freq: Two times a day (BID) | ORAL | Status: DC

## 2012-01-07 MED ORDER — OXYCODONE-ACETAMINOPHEN 10-325 MG PO TABS: 1.0000 | ORAL_TABLET | Freq: Four times a day (QID) | ORAL | Status: DC | PRN

## 2012-02-10 ENCOUNTER — Ambulatory Visit (INDEPENDENT_AMBULATORY_CARE_PROVIDER_SITE_OTHER): Payer: Medicare Other | Admitting: Internal Medicine

## 2012-02-10 ENCOUNTER — Encounter: Payer: Self-pay | Admitting: Internal Medicine

## 2012-02-10 DIAGNOSIS — M5126 Other intervertebral disc displacement, lumbar region: Secondary | ICD-10-CM

## 2012-02-10 DIAGNOSIS — E785 Hyperlipidemia, unspecified: Secondary | ICD-10-CM

## 2012-02-10 DIAGNOSIS — E669 Obesity, unspecified: Secondary | ICD-10-CM

## 2012-02-10 DIAGNOSIS — F329 Major depressive disorder, single episode, unspecified: Secondary | ICD-10-CM

## 2012-02-10 DIAGNOSIS — I10 Essential (primary) hypertension: Secondary | ICD-10-CM

## 2012-02-10 DIAGNOSIS — E1165 Type 2 diabetes mellitus with hyperglycemia: Secondary | ICD-10-CM

## 2012-02-10 DIAGNOSIS — M542 Cervicalgia: Secondary | ICD-10-CM

## 2012-02-10 MED ORDER — OXYCODONE HCL 20 MG PO TB12: 20.0000 mg | ORAL_TABLET | Freq: Two times a day (BID) | ORAL | Status: DC

## 2012-02-10 MED ORDER — CITALOPRAM HYDROBROMIDE 20 MG PO TABS: 20.0000 mg | ORAL_TABLET | Freq: Every day | ORAL | Status: DC

## 2012-02-10 MED ORDER — OXYCODONE-ACETAMINOPHEN 10-325 MG PO TABS: 1.0000 | ORAL_TABLET | Freq: Four times a day (QID) | ORAL | Status: DC | PRN

## 2012-02-10 NOTE — Assessment & Plan Note (Addendum)
He is  requesting a more affordable medication as the Cymbalta is quite expensive.   since he is continuing to use narcotics for pain management use of Cymbalta is not absolutely necessary. We discussed switching to citalopram and a prescription was written for 20 mg daily.

## 2012-02-10 NOTE — Assessment & Plan Note (Addendum)
Still requiring narcotics for management of low back  pain . Refills given today

## 2012-02-10 NOTE — Patient Instructions (Signed)
Consider the Low Glycemic Index Diet and 6 smaller meals daily :   7 AM Low carbohydrate Protein  Shakes (EAS Carb Control  Or Atkins ,  Available everywhere,   In  cases at [employer] )  2.5 carbs  (Add or substitute a toasted sandwhich thin w/ peanut butter)  10 AM: Protein bar by Atkins (snack size,  Chocolate lover's variety at  [employer])    Lunch: sandwich on pita bread or flatbread (Joseph's makes a pita bread and a flat bread , available at [employer] and [employer]; Toufayah makes a low carb flatbread available at [employer] and [employer]) Mission makes a whole wheat low carb tortilla  3 PM:  Mid day :  Another protein bar,  Or a  cheese stick, 1/4 cup of almonds, walnuts, pistachios, pecans, peanuts,  Macadamia nuts  6 PM  Dinner:  "mean and green:"  Meat/chicken/fish, salad, and green veggie : use ranch, vinagrette,  Blue cheese, etc  9 PM snack : Breyer's low carb fudgiscle or  ice cream bar (Carb Smart) Weight Watcher's ice cream bar , or another protein shake  Substitutions should be < 20 grams per serving (Chobani greek yogurt is a  goog option)

## 2012-02-10 NOTE — Progress Notes (Signed)
Patient ID: Austin Rodriguez, male   DOB: 11-06-1945, 66 y.o.   MRN: [id number]   Patient Active Problem List  Diagnoses  . Screening for colon cancer  . Cervicalgia  . 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 Screening for prostate cancer  . Hyperlipidemia  . Hypertension  . Depression  . Anxiety  . Diabetes mellitus type 2, uncontrolled  . Back pain of thoracolumbar region    Subjective:  CC:   Chief Complaint  Patient presents with  . Follow-up    HPI:   Austin Rodriguez a 66 y.o. 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 He underwent a carpal tunnel surgery on the right wrist 4 weeks ago by Dr. Ellwood Handler which is believe some of the pain he used having that we previously presumed was r referred from his neck referred from his neck .   he is scheduled to have surgery on the left on April 17th .   His nocturnal pain is improving he is  sleeping much better since the surgery. He continues to have lower back pain and neck pain and requesting a refill on his  percocet and oxycontin.    Past Medical History  Diagnosis Date  . cervical spondylosis   . Diabetes mellitus   . Chronic neck pain   . Hyperlipidemia   . Hypertension   . Depression   . Anxiety     Past Surgical History  Procedure Date  . Spine surgery   . Carpal tunnel release March 2013    [name]         The following portions of the patient's history were reviewed and updated as appropriate: Allergies, current medications, and problem list.    Review of Systems:   12 Pt  review of systems was negative except those addressed in the HPI,     History   Social History  . Marital Status: Married    Spouse Name: Junious Dresser Lesch    Number of Children: N/A  . Years of Education: N/A   Occupational History  . die cutter operator    Social  History Main Topics  . Smoking status: Never Smoker   . Smokeless tobacco: Never Used  . Alcohol Use: No  . Drug Use: No  . Sexually Active: Not on file   Other Topics Concern  . Not on file   Social History Narrative  . No narrative on file    Objective:  BP 124/68  Pulse 86  Temp(Src) 98.4 F (36.9 C) (Oral)  Resp 16  Wt 238 lb (107.956 kg)  SpO2 96%  General appearance: alert, cooperative and appears stated age Ears: normal TM's and external ear canals both ears Throat: lips, mucosa, and tongue normal; teeth and gums normal Neck: no adenopathy, no carotid bruit, supple, symmetrical, trachea midline and thyroid not enlarged, symmetric, no tenderness/mass/nodules Back: symmetric, no curvature. ROM normal. No CVA tenderness. Lungs: clear to auscultation bilaterally Heart: regular rate and rhythm, S1, S2 normal, no murmur, click, rub or gallop Abdomen: soft, non-tender; bowel sounds normal; no masses,  no organomegaly Pulses: 2+ and symmetric Skin: Skin color, texture, turgor normal. No rashes or lesions Lymph nodes: Cervical, supraclavicular, and axillary nodes normal.  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 Refills given today  Depression He  is  requesting a more affordable medication as the Cymbalta is quite expensive.   since he is continuing to use narcotics for pain management use of Cymbalta is not absolutely necessary. We discussed switching to citalopram and a prescription was written for 20 mg daily.  Hypertension Well controlled on current medications. No changes today.  Diabetes mellitus type 2, uncontrolled His diabetes became uncontrolled in November. He was treated with insulin but did not call for refills and when last seen his oral medications were increased and he was due to return in February for a hemoglobin A1c. He will get that done today as he is  overdue. He states that his fasting sugars have been around 1:30 to 150 and his postprandials have been in the 170 range. His last hemoglobin A1c was 8.9 in November  Hyperlipidemia he has been taking Zocor 80 mg for some time. If his LDL is not at goal with next visit we'll we will recommend change to atorvastatin or rosuvastatin.    Updated Medication List Outpatient Encounter Prescriptions as of 02/10/2012  Medication Sig Dispense Refill  . acetaminophen (TYLENOL) 500 MG tablet Take 500 mg by mouth every 6 (six) hours as needed.        . ALPRAZolam (XANAX) 0.5 MG tablet Take 1 tablet (0.5 mg total) by mouth daily as needed for sleep or anxiety.  30 tablet  3  . aspirin 81 MG tablet Take 81 mg by mouth daily.        . Blood Glucose Monitoring Suppl (BLOOD GLUCOSE METER) kit Use as instructed  1 each  0  . cholecalciferol (VITAMIN D) 1000 UNITS tablet Take 1,000 Units by mouth daily.        . fexofenadine (ALLEGRA) 180 MG tablet Take 1 tablet (180 mg total) by mouth daily.  30 tablet  5  . fish oil-omega-3 fatty acids 1000 MG capsule Take 2 g by mouth daily.        . fluticasone (FLONASE) 50 MCG/ACT nasal spray Place 2 sprays into the nose daily.        Marland Kitchen gabapentin (NEURONTIN) 300 MG capsule Take 2 capsules (600 mg total) by mouth 3 (three) times daily.  540 capsule  3  . glipiZIDE (GLUCOTROL) 5 MG tablet Take 1 tablet (5 mg total) by mouth 2 (two) times daily before a meal.  180 tablet  1  . glucose blood test strip Use as instructed  100 each  12  . Lancets MISC Patient test blood sugar two times a day.  100 each  5  . losartan-hydrochlorothiazide (HYZAAR) 100-12.5 MG per tablet Take 1 tablet by mouth daily.  90 tablet  6  . metFORMIN (GLUCOPHAGE) 500 MG tablet Take 1 tablet (500 mg total) by mouth 2 (two) times daily with a meal.  60 tablet  11  . Multiple Vitamin (MULTIVITAMIN) tablet Take 1 tablet by mouth daily.        Marland Kitchen oxyCODONE-acetaminophen (PERCOCET) 10-325 MG per tablet Take 1  tablet by mouth every 6 (six) hours as needed.  120 tablet  0  . simvastatin (ZOCOR) 20 MG tablet Take 1 tablet (20 mg total) by mouth every evening.  90 tablet  3  . DISCONTD: DULoxetine (CYMBALTA) 60 MG capsule Take 1 capsule (60 mg total) by mouth daily.  90 capsule  3  . DISCONTD: oxyCODONE-acetaminophen (PERCOCET) 10-325 MG per tablet Take 1 tablet by mouth every 6 (six) hours as needed.  120 tablet  0  .  citalopram (CELEXA) 20 MG tablet Take 1 tablet (20 mg total) by mouth daily.  30 tablet  11  . oxyCODONE (OXYCONTIN) 20 MG 12 hr tablet Take 1 tablet (20 mg total) by mouth every 12 (twelve) hours.  60 tablet  0     Orders Placed This Encounter  Procedures  . MyChart Weight Flowsheet    No Follow-up on file.

## 2012-02-11 ENCOUNTER — Encounter: Payer: Self-pay | Admitting: Internal Medicine

## 2012-02-11 NOTE — Assessment & Plan Note (Signed)
Well controlled on current medications.  No changes today. 

## 2012-02-11 NOTE — Assessment & Plan Note (Signed)
he has been taking Zocor 80 mg for some time. If his LDL is not at goal with next visit we'll we will recommend change to atorvastatin or rosuvastatin.

## 2012-02-11 NOTE — Assessment & Plan Note (Signed)
His diabetes became uncontrolled in November. He was treated with insulin but did not call for refills and when last seen his oral medications were increased and he was due to return in February for a hemoglobin A1c. He will get that done today as he is overdue. He states that his fasting sugars have been around 1:30 to 150 and his postprandials have been in the 170 range. His last hemoglobin A1c was 8.9 in November

## 2012-03-09 ENCOUNTER — Telehealth: Payer: Self-pay | Admitting: Internal Medicine

## 2012-03-09 DIAGNOSIS — M542 Cervicalgia: Secondary | ICD-10-CM

## 2012-03-09 MED ORDER — OXYCODONE-ACETAMINOPHEN 10-325 MG PO TABS: 1.0000 | ORAL_TABLET | Freq: Four times a day (QID) | ORAL | Status: DC | PRN

## 2012-03-09 NOTE — Telephone Encounter (Signed)
Rx printed, waiting for signature.  I will call patient when ready.

## 2012-03-09 NOTE — Telephone Encounter (Signed)
[phone] Pt needs refill on oxyoctin  Please let pt know when ready

## 2012-03-11 ENCOUNTER — Other Ambulatory Visit: Payer: Self-pay | Admitting: Internal Medicine

## 2012-03-11 MED ORDER — OXYCODONE HCL 20 MG PO TB12: 20.0000 mg | ORAL_TABLET | Freq: Two times a day (BID) | ORAL | Status: DC

## 2012-04-11 ENCOUNTER — Other Ambulatory Visit: Payer: Self-pay | Admitting: Internal Medicine

## 2012-04-11 MED ORDER — OXYCODONE HCL 20 MG PO TB12: 20.0000 mg | ORAL_TABLET | Freq: Two times a day (BID) | ORAL | Status: DC

## 2012-04-14 ENCOUNTER — Telehealth: Payer: Self-pay | Admitting: Internal Medicine

## 2012-04-14 NOTE — Telephone Encounter (Signed)
Not something I can really manage by phone without more information.  Is he willing? He can continue every other day oxycontin until then

## 2012-04-14 NOTE — Telephone Encounter (Signed)
Patient called and stated he is trying to taper himself off of OxyContin (he stated he is already completley off of Percocet).  He is down to taking it one tablet every other day, but he feels like he can't come off of it all together.  He wanted to know what else he can do to get off the medication.  Please advise.

## 2012-04-15 NOTE — Telephone Encounter (Signed)
Patient notified, he made an appt for Thursday.  He wanted to know if he could a half of an Rx of Oxycontin to take until he is completely tapered off.  He stated he just got an Rx for it so he can return it if that's ok.  Please advise.

## 2012-04-15 NOTE — Telephone Encounter (Signed)
Left message asking patient to return my call.

## 2012-04-17 NOTE — Telephone Encounter (Signed)
He doesn't need a new rx  if he wants to fill it for less he can do that by asking for less from the pharmacist.

## 2012-04-18 NOTE — Telephone Encounter (Signed)
Patient notified

## 2012-04-19 ENCOUNTER — Other Ambulatory Visit: Payer: Self-pay | Admitting: Internal Medicine

## 2012-04-19 LAB — COMPREHENSIVE METABOLIC PANEL
Albumin: 3.9 g/dL (ref 3.4–5.0)
Alkaline Phosphatase: 120 U/L (ref 50–136)
Anion Gap: 6 — ABNORMAL LOW (ref 7–16)
BUN: 24 mg/dL — ABNORMAL HIGH (ref 7–18)
Bilirubin,Total: 0.6 mg/dL (ref 0.2–1.0)
Calcium, Total: 9.4 mg/dL (ref 8.5–10.1)
Chloride: 111 mmol/L — ABNORMAL HIGH (ref 98–107)
Co2: 25 mmol/L (ref 21–32)
Creatinine: 1.37 mg/dL — ABNORMAL HIGH (ref 0.60–1.30)
Glucose: 110 mg/dL — ABNORMAL HIGH (ref 65–99)
Osmolality: 288 (ref 275–301)
SGPT (ALT): 60 U/L
Total Protein: 7.8 g/dL (ref 6.4–8.2)

## 2012-04-19 LAB — LIPASE, BLOOD: Lipase: 342 U/L (ref 73–393)

## 2012-04-22 ENCOUNTER — Ambulatory Visit (INDEPENDENT_AMBULATORY_CARE_PROVIDER_SITE_OTHER): Payer: Medicare Other | Admitting: Internal Medicine

## 2012-04-22 ENCOUNTER — Encounter: Payer: Self-pay | Admitting: Internal Medicine

## 2012-04-22 VITALS — BP 124/66 | HR 78 | Temp 98.0°F | Resp 16 | Wt 217.2 lb

## 2012-04-22 DIAGNOSIS — E119 Type 2 diabetes mellitus without complications: Secondary | ICD-10-CM

## 2012-04-22 DIAGNOSIS — M5430 Sciatica, unspecified side: Secondary | ICD-10-CM

## 2012-04-22 DIAGNOSIS — M5440 Lumbago with sciatica, unspecified side: Secondary | ICD-10-CM

## 2012-04-22 DIAGNOSIS — E1139 Type 2 diabetes mellitus with other diabetic ophthalmic complication: Secondary | ICD-10-CM | POA: Insufficient documentation

## 2012-04-22 DIAGNOSIS — R7989 Other specified abnormal findings of blood chemistry: Secondary | ICD-10-CM

## 2012-04-22 DIAGNOSIS — R079 Chest pain, unspecified: Secondary | ICD-10-CM

## 2012-04-22 DIAGNOSIS — E785 Hyperlipidemia, unspecified: Secondary | ICD-10-CM

## 2012-04-22 MED ORDER — TRAMADOL HCL 50 MG PO TABS: 50.0000 mg | ORAL_TABLET | Freq: Four times a day (QID) | ORAL | Status: DC | PRN

## 2012-04-22 MED ORDER — LOSARTAN POTASSIUM 100 MG PO TABS: 100.0000 mg | ORAL_TABLET | Freq: Every day | ORAL | Status: DC

## 2012-04-22 MED ORDER — ALPRAZOLAM 0.5 MG PO TABS: 0.5000 mg | ORAL_TABLET | Freq: Every day | ORAL | Status: DC | PRN

## 2012-04-22 NOTE — Patient Instructions (Addendum)
Congratulations!!  Your diet has cured your diabetes!  Your hgba1c is now 5.3  Stop the simvastatin and the metformin for now.  They may be elevating your liver enzymes.   Try the EAS protein powder for your protien shakes.  It is available in bulk at [facility]. It is the same powder that is used in the carb control shakes  (NOT the myoplex) shakes.     We are changing your blood pressure  medication to eliminate the hct (diuretic) . I have sent an rxs for losartan 100 mg to your pharmacy   Try not to use the alprazolam more than once a day.  You can use the advil PM at bedtime for insmonia if it works  Because Your liver enzyme is elevated, we are stopping the simvastatin and will repeat blood work in 2 weeks to see if it has resolved.   You can take tramadol  every 8 hours if needed for pain .  Avoid advil and tylenol (except at bedtime) while we figure out what is affecting your liver.   Your EKG was normal.  We will refer to [facility] cardioiogy for a stress test on your heart

## 2012-04-22 NOTE — Progress Notes (Signed)
Patient ID: Austin Rodriguez, male   DOB: 30-Jan-1946, 66 y.o.   MRN: [id number]  Patient Active Problem List  Diagnosis  . Screening for colon cancer  . Cervicalgia  . Lumbago with sciatica  . Screening for prostate cancer  . Hyperlipidemia  . Hypertension  . Depression  . Anxiety  . Back pain of thoracolumbar region  . Diabetes mellitus type 2, diet-controlled  . Other abnormal blood chemistry    Subjective:  CC:   Chief Complaint  Patient presents with  . Medication Management    HPI:   Austin Rodriguez a 66 y.o. male who presents for follow up on opioid dependence secondary to chronic back pain, and diabetes mellitus  .  He has lost 20 lbs, and has dropped a1c from 8.9 to 5.3  Since his last visit.  His bp is well controlled.  He has been following a low carb diet.  Has stopped his glipizide.4 week s ago.  He has weaned himself completely off of percocet.  Has had several episodes of chest pain at rest since he came off the percocet.  The pain does not occur while exercising   Past Medical History  Diagnosis Date  . cervical spondylosis   . Diabetes mellitus   . Chronic neck pain   . Hyperlipidemia   . Hypertension   . Depression   . Anxiety     Past Surgical History  Procedure Date  . Spine surgery   . Carpal tunnel release March 2013    Austin Rodriguez  . Hernia repair     left inguinal         The following portions of the patient's history were reviewed and updated as appropriate: Allergies, current medications, and problem list.    Review of Systems:  Comprehensive  review of systems was negative except those addressed in the HPI,     History   Social History  . Marital Status: Married    Spouse Name: Junious Dresser Opfer    Number of Children: N/A  . Years of Education: N/A   Occupational History  . die cutter operator    Social History Main Topics  . Smoking status: Never Smoker   . Smokeless tobacco: Never Used  . Alcohol Use: No  .  Drug Use: No  . Sexually Active: Not on file   Other Topics Concern  . Not on file   Social History Narrative  . No narrative on file    Objective:  BP 124/66  Pulse 78  Temp 98 F (36.7 C) (Oral)  Resp 16  Wt 217 lb 4 oz (98.544 kg)  SpO2 97%  General appearance: alert, cooperative and appears stated age Ears: normal TM's and external ear canals both ears Throat: lips, mucosa, and tongue normal; teeth and gums normal Neck: no adenopathy, no carotid bruit, supple, symmetrical, trachea midline and thyroid not enlarged, symmetric, no tenderness/mass/nodules Back: symmetric, no curvature. ROM normal. No CVA tenderness. Lungs: clear to auscultation bilaterally Heart: regular rate and rhythm, S1, S2 normal, no murmur, click, rub or gallop Abdomen: soft, non-tender; bowel sounds normal; no masses,  no organomegaly Pulses: 2+ and symmetric Skin: Skin color, texture, turgor normal. No rashes or lesions Lymph nodes: Cervical, supraclavicular, and axillary nodes normal.  Assessment and Plan:  Diabetes mellitus type 2, diet-controlled Now resolved with weight loss and low glycemic index diet. Will stop the metformin.   Hyperlipidemia Controlled with simvastatin , but LFTs are elevated.  Stopping simvastatin and  will repeat lfts in a few weeks.   Lumbago with sciatica He has now weaned himself off of percocet completely and is pain is controlled without narcotics.  Other abnormal blood chemistry LFTs were recently elevated, which may be sue to simvastatin.  Statin and metformin were stopped.  Repeat in 4 weeks.    Updated Medication List Outpatient Encounter Prescriptions as of 04/22/2012  Medication Sig Dispense Refill  . ALPRAZolam (XANAX) 0.5 MG tablet Take 1 tablet (0.5 mg total) by mouth daily as needed for anxiety.  30 tablet  3  . aspirin 81 MG tablet Take 81 mg by mouth daily.        . Blood Glucose Monitoring Suppl (BLOOD GLUCOSE METER) kit Use as instructed  1 each  0   . cholecalciferol (VITAMIN D) 1000 UNITS tablet Take 1,000 Units by mouth daily.        . citalopram (CELEXA) 20 MG tablet Take 1 tablet (20 mg total) by mouth daily.  30 tablet  11  . fexofenadine (ALLEGRA) 180 MG tablet Take 1 tablet (180 mg total) by mouth daily.  30 tablet  5  . fish oil-omega-3 fatty acids 1000 MG capsule Take 2 g by mouth daily.        Marland Kitchen gabapentin (NEURONTIN) 300 MG capsule Take 2 capsules (600 mg total) by mouth 3 (three) times daily.  540 capsule  3  . glucose blood test strip Use as instructed  100 each  12  . ibuprofen (ADVIL,MOTRIN) 200 MG tablet Take 200 mg by mouth every 6 (six) hours as needed.      . Lancets MISC Patient test blood sugar two times a day.  100 each  5  . metFORMIN (GLUCOPHAGE) 500 MG tablet Take 1 tablet (500 mg total) by mouth 2 (two) times daily with a meal.  60 tablet  11  . Multiple Vitamin (MULTIVITAMIN) tablet Take 1 tablet by mouth daily.        Marland Kitchen DISCONTD: ALPRAZolam (XANAX) 0.5 MG tablet Take 1 tablet (0.5 mg total) by mouth daily as needed for sleep or anxiety.  30 tablet  3  . DISCONTD: losartan-hydrochlorothiazide (HYZAAR) 100-12.5 MG per tablet Take 1 tablet by mouth daily.  90 tablet  6  . DISCONTD: simvastatin (ZOCOR) 20 MG tablet Take 1 tablet (20 mg total) by mouth every evening.  90 tablet  3  . losartan (COZAAR) 100 MG tablet Take 1 tablet (100 mg total) by mouth daily.  90 tablet  3  . traMADol (ULTRAM) 50 MG tablet Take 1 tablet (50 mg total) by mouth every 6 (six) hours as needed for pain.  90 tablet  2  . DISCONTD: acetaminophen (TYLENOL) 500 MG tablet Take 500 mg by mouth every 6 (six) hours as needed.        Marland Kitchen DISCONTD: fluticasone (FLONASE) 50 MCG/ACT nasal spray Place 2 sprays into the nose daily.        Marland Kitchen DISCONTD: glipiZIDE (GLUCOTROL) 5 MG tablet Take 1 tablet (5 mg total) by mouth 2 (two) times daily before a meal.  180 tablet  1  . DISCONTD: oxyCODONE (OXYCONTIN) 20 MG 12 hr tablet Take 1 tablet (20 mg total) by  mouth every 12 (twelve) hours.  60 tablet  0  . DISCONTD: oxyCODONE-acetaminophen (PERCOCET) 10-325 MG per tablet Take 1 tablet by mouth every 6 (six) hours as needed.  120 tablet  0     Orders Placed This Encounter  Procedures  . EKG 12-Lead  Return in about 3 months (around 07/23/2012).

## 2012-04-24 ENCOUNTER — Encounter: Payer: Self-pay | Admitting: Internal Medicine

## 2012-04-24 DIAGNOSIS — R7989 Other specified abnormal findings of blood chemistry: Secondary | ICD-10-CM | POA: Insufficient documentation

## 2012-04-24 NOTE — Assessment & Plan Note (Signed)
Controlled with simvastatin , but LFTs are elevated.  Stopping simvastatin and will repeat lfts in a few weeks.

## 2012-04-24 NOTE — Assessment & Plan Note (Addendum)
Now resolved with weight loss and low glycemic index diet. Will stop the metformin.

## 2012-04-24 NOTE — Assessment & Plan Note (Signed)
He has now weaned himself off of percocet completely and is pain is controlled without narcotics.

## 2012-04-24 NOTE — Assessment & Plan Note (Signed)
LFTs were recently elevated, which may be sue to simvastatin.  Statin and metformin were stopped.  Repeat in 4 weeks.

## 2012-05-02 ENCOUNTER — Encounter: Payer: Self-pay | Admitting: Internal Medicine

## 2012-05-02 ENCOUNTER — Other Ambulatory Visit: Payer: Self-pay | Admitting: Internal Medicine

## 2012-05-02 LAB — COMPREHENSIVE METABOLIC PANEL
Anion Gap: 6 — ABNORMAL LOW (ref 7–16)
BUN: 21 mg/dL — ABNORMAL HIGH (ref 7–18)
Chloride: 110 mmol/L — ABNORMAL HIGH (ref 98–107)
EGFR (African American): >60
EGFR (Non-African Amer.): >60
Osmolality: 288 (ref 275–301)
Potassium: 4.2 mmol/L (ref 3.5–5.1)
SGOT(AST): 32 U/L (ref 15–37)
Sodium: 143 mmol/L (ref 136–145)
Total Protein: 7.5 g/dL (ref 6.4–8.2)

## 2012-05-02 LAB — CK: CK, Total: 156 U/L (ref 35–232)

## 2012-05-04 ENCOUNTER — Telehealth: Payer: Self-pay | Admitting: Internal Medicine

## 2012-05-04 ENCOUNTER — Other Ambulatory Visit: Payer: Self-pay | Admitting: Internal Medicine

## 2012-05-04 DIAGNOSIS — R079 Chest pain, unspecified: Secondary | ICD-10-CM

## 2012-05-04 NOTE — Telephone Encounter (Signed)
All labs normal,.

## 2012-05-04 NOTE — Telephone Encounter (Signed)
Patient notified of labs. Also he says that he was supposed to have a stress test done, but he says that he hasn't heard anything about it. I do not see order in his chart. Please advise.

## 2012-05-04 NOTE — Telephone Encounter (Signed)
Order in EPIC now.  Referral to [facility] underway

## 2012-05-11 ENCOUNTER — Ambulatory Visit: Payer: Medicare Other | Admitting: Internal Medicine

## 2012-05-12 ENCOUNTER — Ambulatory Visit (INDEPENDENT_AMBULATORY_CARE_PROVIDER_SITE_OTHER): Payer: Medicare Other | Admitting: Cardiovascular Disease

## 2012-05-12 ENCOUNTER — Encounter: Payer: Self-pay | Admitting: Cardiovascular Disease

## 2012-05-12 VITALS — BP 120/70 | HR 88 | Ht 71.0 in | Wt 214.8 lb

## 2012-05-12 DIAGNOSIS — E785 Hyperlipidemia, unspecified: Secondary | ICD-10-CM

## 2012-05-12 DIAGNOSIS — I10 Essential (primary) hypertension: Secondary | ICD-10-CM

## 2012-05-12 DIAGNOSIS — Z0000 Encounter for general adult medical examination without abnormal findings: Secondary | ICD-10-CM | POA: Insufficient documentation

## 2012-05-12 DIAGNOSIS — E119 Type 2 diabetes mellitus without complications: Secondary | ICD-10-CM

## 2012-05-12 DIAGNOSIS — R0789 Other chest pain: Secondary | ICD-10-CM

## 2012-05-12 NOTE — Assessment & Plan Note (Signed)
Symptoms are very atypical. We have suggested if he has worsening symptoms, that he call your office for a routine treadmill study. He does not want to schedule one at this time.

## 2012-05-12 NOTE — Patient Instructions (Addendum)
You are doing well. No medication changes were made.  Please call us if you have new issues that need to be addressed before your next appt.  Your physician wants you to follow-up in: 12 months.  You will receive a reminder letter in the mail two months in advance. If you don't receive a letter, please call our office to schedule the follow-up appointment. 

## 2012-05-12 NOTE — Assessment & Plan Note (Signed)
He is doing well from a cardiac perspective. Diabetes is well-controlled, he is not a smoker. On statin, his cholesterol has been well controlled. He is exercising with no symptoms of chest discomfort.

## 2012-05-12 NOTE — Assessment & Plan Note (Signed)
Hemoglobin A1c is well controlled. We have commended him on his weight loss and exercise and change of his diet.

## 2012-05-12 NOTE — Progress Notes (Signed)
Patient ID: Austin Rodriguez, male    DOB: Mar 09, 1946, 66 y.o.   MRN: [id number]  HPI Comments:  Austin Rodriguez a 66 y.o. Male with a history of chronic back pain and diabetes, previous opioid dependence, recent 20 pound weight loss with improvement of his hemoglobin A1c, strong family history of coronary artery disease who presents for risk stratification, optimization of his medications and for evaluation of right-sided chest pain.  He reports that he exercises 5 days per week and does not have any chest pain with exertion. Occasionally at rest, he will develop pain in his right pectoral area, sometimes extending into his right arm. Symptoms are mild, sometimes exacerbated with movement or palpation. Typically at rest. It improves with activity. He has had this for 3-4 months, about every day. He denies any significant chest pain with exertion, shortness of breath, edema, lightheadedness  Recent total cholesterol 120, LDL 70. Simvastatin was held secondary to elevated LFTs. He feels this was secondary to taking significant amounts of opioids with Tylenol  EKG shows normal sinus rhythm with rate 68 beats per minute with no significant ST or T wave changes   Outpatient Encounter Prescriptions as of 05/12/2012  Medication Sig Dispense Refill  . ALPRAZolam (XANAX) 0.5 MG tablet Take 1 tablet (0.5 mg total) by mouth daily as needed for anxiety.  30 tablet  3  . aspirin 81 MG tablet Take 81 mg by mouth daily.        . Blood Glucose Monitoring Suppl (BLOOD GLUCOSE METER) kit Use as instructed  1 each  0  . cholecalciferol (VITAMIN D) 1000 UNITS tablet Take 1,000 Units by mouth daily.        . citalopram (CELEXA) 20 MG tablet Take 1 tablet (20 mg total) by mouth daily.  30 tablet  11  . fexofenadine (ALLEGRA) 180 MG tablet Take 1 tablet (180 mg total) by mouth daily.  30 tablet  5  . fish oil-omega-3 fatty acids 1000 MG capsule Take 2 g by mouth daily.        Marland Kitchen gabapentin (NEURONTIN) 300 MG capsule  Take 2 capsules (600 mg total) by mouth 3 (three) times daily.  540 capsule  3  . glucose blood test strip Use as instructed  100 each  12  . Lancets MISC Patient test blood sugar two times a day.  100 each  5  . losartan (COZAAR) 100 MG tablet Take 1 tablet (100 mg total) by mouth daily.  90 tablet  3  . Multiple Vitamin (MULTIVITAMIN) tablet Take 1 tablet by mouth daily.        . traMADol (ULTRAM) 50 MG tablet Take 50 mg by mouth every 6 (six) hours as needed.       Review of Systems  Constitutional: Negative.   HENT: Negative.   Eyes: Negative.   Respiratory: Negative.   Cardiovascular: Negative.        Rare right-sided chest pain at rest  Gastrointestinal: Negative.   Musculoskeletal: Negative.   Skin: Negative.   Neurological: Negative.   Hematological: Negative.   Psychiatric/Behavioral: Negative.   All other systems reviewed and are negative.     BP 120/70  Pulse 88  Ht 5\' 11"  (1.803 m)  Wt 214 lb 12 oz (97.41 kg)  BMI 29.95 kg/m2  Physical Exam  Nursing note and vitals reviewed. Constitutional: He is oriented to person, place, and time. He appears well-developed and well-nourished.  HENT:  Head: Normocephalic.  Nose: Nose normal.  Mouth/Throat: Oropharynx is clear and moist.  Eyes: Conjunctivae are normal. Pupils are equal, round, and reactive to light.  Neck: Normal range of motion. Neck supple. No JVD present.  Cardiovascular: Normal rate, regular rhythm, S1 normal, S2 normal, normal heart sounds and intact distal pulses.  Exam reveals no gallop and no friction rub.   No murmur heard. Pulmonary/Chest: Effort normal and breath sounds normal. No respiratory distress. He has no wheezes. He has no rales. He exhibits no tenderness.  Abdominal: Soft. Bowel sounds are normal. He exhibits no distension. There is no tenderness.  Musculoskeletal: Normal range of motion. He exhibits no edema and no tenderness.  Lymphadenopathy:    He has no cervical adenopathy.    Neurological: He is alert and oriented to person, place, and time. Coordination normal.  Skin: Skin is warm and dry. No rash noted. No erythema.  Psychiatric: He has a normal mood and affect. His behavior is normal. Judgment and thought content normal.           Assessment and Plan

## 2012-05-12 NOTE — Assessment & Plan Note (Signed)
I have asked him to discuss with Dr. Darrick Huntsman whether he can restart simvastatin as his LFTs have improved. Other option would be to start alternate statin such as Lipitor or Crestor. Would continue to treat aggressively given strong family history.

## 2012-05-12 NOTE — Assessment & Plan Note (Signed)
Blood pressure is well controlled on today's visit. No changes made to the medications. 

## 2012-05-13 ENCOUNTER — Telehealth: Payer: Self-pay | Admitting: Internal Medicine

## 2012-05-13 NOTE — Telephone Encounter (Signed)
absolutely

## 2012-05-13 NOTE — Telephone Encounter (Signed)
Patients wife notified

## 2012-05-13 NOTE — Telephone Encounter (Signed)
Patient called and stated he had an appt with Dr. Mariah Milling and he stated he could go back on simvastatin, he wanted to know if that was ok with you.  Please advise.

## 2012-05-13 NOTE — Telephone Encounter (Signed)
Yes,  He can go back on the simvastatin

## 2012-05-23 ENCOUNTER — Encounter: Payer: Self-pay | Admitting: Internal Medicine

## 2012-05-30 ENCOUNTER — Other Ambulatory Visit: Payer: Self-pay | Admitting: Internal Medicine

## 2012-07-06 ENCOUNTER — Other Ambulatory Visit: Payer: Self-pay | Admitting: Internal Medicine

## 2012-07-25 ENCOUNTER — Ambulatory Visit: Payer: Medicare Other | Admitting: Internal Medicine

## 2012-07-26 ENCOUNTER — Other Ambulatory Visit: Payer: Self-pay | Admitting: Internal Medicine

## 2012-07-26 MED ORDER — SIMVASTATIN 20 MG PO TABS: 20.0000 mg | ORAL_TABLET | Freq: Every evening | ORAL | Status: DC

## 2012-08-02 ENCOUNTER — Encounter: Payer: Self-pay | Admitting: Internal Medicine

## 2012-08-02 ENCOUNTER — Ambulatory Visit (INDEPENDENT_AMBULATORY_CARE_PROVIDER_SITE_OTHER): Payer: Medicare Other | Admitting: Internal Medicine

## 2012-08-02 VITALS — BP 130/82 | HR 77 | Temp 98.8°F | Resp 16 | Ht 71.0 in | Wt 214.0 lb

## 2012-08-02 DIAGNOSIS — E119 Type 2 diabetes mellitus without complications: Secondary | ICD-10-CM

## 2012-08-02 DIAGNOSIS — Z23 Encounter for immunization: Secondary | ICD-10-CM

## 2012-08-02 DIAGNOSIS — B070 Plantar wart: Secondary | ICD-10-CM

## 2012-08-02 MED ORDER — ZOSTER VACCINE LIVE 19400 UNT/0.65ML ~~LOC~~ SOLR: 0.6500 mL | Freq: Once | SUBCUTANEOUS | Status: DC

## 2012-08-02 MED ORDER — SCOPOLAMINE 1 MG/3DAYS TD PT72: 1.0000 | MEDICATED_PATCH | TRANSDERMAL | Status: DC

## 2012-08-02 NOTE — Progress Notes (Signed)
Patient ID: Austin Rodriguez, male   DOB: 1946/10/20, 66 y.o.   MRN: [id number]  Patient Active Problem List  Diagnosis  . Screening for colon cancer  . Cervicalgia  . Lumbago with sciatica  . Screening for prostate cancer  . Hyperlipidemia  . Hypertension  . Depression  . Anxiety  . Back pain of thoracolumbar region  . Diabetes mellitus type 2, diet-controlled  . Other abnormal blood chemistry  . Encounter for preventive health examination  . Atypical chest pain  . Plantar warts    Subjective:  CC:   Chief Complaint  Patient presents with  . Follow-up    DM, hyperlidipemia; doing well; declined flu shot, will get at wife work    HPI:   Austin Rodriguez a 66 y.o. male who presents for followup on diabetes mellitus, obesity, hypertension, chronic neck pain due to DDD and hyperlipidemia. He has a new issue of recurrent wart plantar warts occurring on his hands. The were first currently 5th finger on the left, and was treated with liquid nitrogen by his dermatologist about 3 months ago. However it has returned and now has a second one forming at next visit as well as several on his right hand. He is wondering if there is an alternative to liquid nitrogen. He's never had warts before. The warts did start before [place] he has lost over 35 pounds since starting on a low glycemic index diet. His diabetes is well-controlled. He has had no recent lows. He is tolerating all his medications well. He is also completely wean himself from all narcotics for use of a chronic neck pain. He's using tramadol as needed. He feels much better and has stated that he never wants to be on narcotics again.   Past Medical History  Diagnosis Date  . cervical spondylosis   . Diabetes mellitus   . Chronic neck pain   . Hyperlipidemia   . Hypertension   . Depression   . Anxiety     Past Surgical History  Procedure Date  . Spine surgery   . Carpal tunnel release  March 2013    Austin Rodriguez  . Hernia repair     left inguinal         The following portions of the patient's history were reviewed and updated as appropriate: Allergies, current medications, and problem list.    Review of Systems:   12 Pt  review of systems was negative except those addressed in the HPI,     History   Social History  . Marital Status: Married    Spouse Name: Austin Rodriguez    Number of Children: N/A  . Years of Education: N/A   Occupational History  . die cutter operator    Social History Main Topics  . Smoking status: Never Smoker   . Smokeless tobacco: Never Used  . Alcohol Use: No  . Drug Use: No  . Sexually Active: Not on file   Other Topics Concern  . Not on file   Social History Narrative  . No narrative on file    Objective:  BP 130/82  Pulse 77  Temp 98.8 F (37.1 C) (Oral)  Resp 16  Ht 5\' 11"  (1.803 m)  Wt 214 lb (97.07 kg)  BMI 29.85 kg/m2  SpO2 96%  General appearance: alert, cooperative and appears stated age Ears: normal TM's and external ear canals both ears Throat: lips, mucosa, and tongue normal;  teeth and gums normal Neck: no adenopathy, no carotid bruit, supple, symmetrical, trachea midline and thyroid not enlarged, symmetric, no tenderness/mass/nodules Back: symmetric, no curvature. ROM normal. No CVA tenderness. Lungs: clear to auscultation bilaterally Heart: regular rate and rhythm, S1, S2 normal, no murmur, click, rub or gallop Abdomen: soft, non-tender; bowel sounds normal; no masses,  no organomegaly Pulses: 2+ and symmetric Skin: 1 large hyperkeratotic wart on his finger left hand. Several smaller warts on left and right hands.  Lymph nodes: Cervical, supraclavicular, and axillary nodes normal.  Assessment and Plan:  Plantar warts He has recurrent warts on his hands which have recurred with new lesions now noted on the right hand. He is oriented he is on burn off by his dermatologist once with liquid  nitroglycerin.  Salicylic acid is not a good option since he swims daily for exercise.  He is requesting an alternative. If his insurance will cover" mode we will try this medication  Diabetes mellitus type 2, diet-controlled His diabetes remains well controlled on diet alone. He is due for a repeat A1c in a month. Continue losartan for management of hypertension and simvastatin for management of hyperlipidemia. Exam today was normal. Reminder for annual dilated retinal exam given.   Updated Medication List Outpatient Encounter Prescriptions as of 08/02/2012  Medication Sig Dispense Refill  . ALPRAZolam (XANAX) 0.5 MG tablet Take 1 tablet (0.5 mg total) by mouth daily as needed for anxiety.  30 tablet  3  . aspirin 81 MG tablet Take 81 mg by mouth daily.        . Blood Glucose Monitoring Suppl (BLOOD GLUCOSE METER) kit Use as instructed  1 each  0  . cholecalciferol (VITAMIN D) 1000 UNITS tablet Take 1,000 Units by mouth daily.        . citalopram (CELEXA) 20 MG tablet Take 1 tablet (20 mg total) by mouth daily.  30 tablet  11  . fexofenadine (ALLEGRA) 180 MG tablet Take 1 tablet (180 mg total) by mouth daily.  30 tablet  5  . fish oil-omega-3 fatty acids 1000 MG capsule Take 2 g by mouth daily.        Marland Kitchen gabapentin (NEURONTIN) 300 MG capsule Take 2 capsules (600 mg total) by mouth 3 (three) times daily.  540 capsule  3  . glucose blood test strip Use as instructed  100 each  12  . Lancets MISC Patient test blood sugar two times a day.  100 each  5  . losartan (COZAAR) 100 MG tablet Take 1 tablet (100 mg total) by mouth daily.  90 tablet  3  . Multiple Vitamin (MULTIVITAMIN) tablet Take 1 tablet by mouth daily.        . simvastatin (ZOCOR) 20 MG tablet Take 1 tablet (20 mg total) by mouth every evening.  90 tablet  2  . traMADol (ULTRAM) 50 MG tablet Take 50 mg by mouth every 6 (six) hours as needed.      . traMADol (ULTRAM) 50 MG tablet TAKE 1 TABLET BY MOUTH EVERY 6 HOURS AS NEEDED FOR PAIN.   90 tablet  2  . Imiquimod 3.75 % CREA Apply daily in the evening to affected areas after soaking hands in warm water for 15 minutes  28 each  0  . scopolamine (TRANSDERM-SCOP) 1.5 MG Place 1 patch (1.5 mg total) onto the skin every 3 (three) days.  10 patch  0  . zoster vaccine live, PF, (ZOSTAVAX) 69629 UNT/0.65ML injection Inject 19,400 Units into  the skin once.  1 vial  0  . DISCONTD: glipiZIDE (GLUCOTROL) 5 MG tablet TAKE 1 TABLET (5 MG TOTAL) BY MOUTH 2 (TWO) TIMES DAILY BEFORE A MEAL.  180 tablet  3     Orders Placed This Encounter  Procedures  . Pneumococcal polysaccharide vaccine 23-valent greater than or equal to 2yo subcutaneous/IM  . Lipid panel  . Comprehensive metabolic panel  . Microalbumin / creatinine urine ratio  . HM COLONOSCOPY    No Follow-up on file.

## 2012-08-02 NOTE — Patient Instructions (Addendum)
Return for fasting labs at your leisure  But drink water  The scopalamine patch for sea sickness can be used for the trip,  Change it every 3 days,  Box of 10   I will look into alternatives for the wart destruction ,   Check on the TdaP vaccine at the health Dept ($110 here)

## 2012-08-03 ENCOUNTER — Telehealth: Payer: Self-pay | Admitting: Internal Medicine

## 2012-08-03 ENCOUNTER — Other Ambulatory Visit (INDEPENDENT_AMBULATORY_CARE_PROVIDER_SITE_OTHER): Payer: Medicare Other

## 2012-08-03 ENCOUNTER — Encounter: Payer: Self-pay | Admitting: Internal Medicine

## 2012-08-03 DIAGNOSIS — B070 Plantar wart: Secondary | ICD-10-CM | POA: Insufficient documentation

## 2012-08-03 DIAGNOSIS — E119 Type 2 diabetes mellitus without complications: Secondary | ICD-10-CM

## 2012-08-03 LAB — LIPID PANEL
HDL: 38.9 mg/dL — ABNORMAL LOW (ref >39.00)
LDL Cholesterol: 82 mg/dL (ref 0–99)
Total CHOL/HDL Ratio: 3
Triglycerides: 57 mg/dL (ref 0.0–149.0)
VLDL: 11.4 mg/dL (ref 0.0–40.0)

## 2012-08-03 LAB — COMPREHENSIVE METABOLIC PANEL
ALT: 22 U/L (ref 0–53)
AST: 24 U/L (ref 0–37)
Alkaline Phosphatase: 94 U/L (ref 39–117)
BUN: 23 mg/dL (ref 6–23)
Creatinine, Ser: 1.2 mg/dL (ref 0.4–1.5)
Total Bilirubin: 0.7 mg/dL (ref 0.3–1.2)

## 2012-08-03 LAB — MICROALBUMIN / CREATININE URINE RATIO: Microalb Creat Ratio: 30.5 mg/g — ABNORMAL HIGH (ref 0.0–30.0)

## 2012-08-03 MED ORDER — IMIQUIMOD 3.75 % EX CREA: TOPICAL_CREAM | CUTANEOUS | Status: DC

## 2012-08-03 NOTE — Assessment & Plan Note (Signed)
He has recurrent warts on his hands which have recurred with new lesions now noted on the right hand. He is oriented he is on burn off by his dermatologist once with liquid nitroglycerin.  Salicylic acid is not a good option since he swims daily for exercise.  He is requesting an alternative. If his insurance will cover" mode we will try this medication

## 2012-08-03 NOTE — Telephone Encounter (Signed)
Sorry about that.   No that was it.

## 2012-08-03 NOTE — Assessment & Plan Note (Signed)
His diabetes remains well controlled on diet alone. He is due for a repeat A1c in a month. Continue losartan for management of hypertension and simvastatin for management of hyperlipidemia. Exam today was normal. Reminder for annual dilated retinal exam given.

## 2012-08-03 NOTE — Telephone Encounter (Signed)
Was this note finished? It looked as though you were meaning to type something else.

## 2012-08-03 NOTE — Telephone Encounter (Signed)
I have researched available alternatives for treating the warts on his hand. Because of his exercise with swimming daily,  several cheaper options are not likely to work.  There is a medication called Imiquimod. It is applied topically to the affected areas. The large wart on his finger on the left hand  will need to be abraded slightly with a pumice stone to remove the callus that has formed over top of it. He should do this gently followed by soaking his hands in warm water for 15 minutes. He can then apply the ointment to all the lesions at bedtime and then wash off in the morning.  he'll need to do this daily for a month. l

## 2012-08-04 NOTE — Telephone Encounter (Signed)
Patient notified and given instructions.

## 2012-09-07 ENCOUNTER — Other Ambulatory Visit: Payer: Self-pay | Admitting: *Deleted

## 2012-09-07 ENCOUNTER — Other Ambulatory Visit: Payer: Self-pay | Admitting: Internal Medicine

## 2012-09-07 NOTE — Telephone Encounter (Signed)
Faxed in to pharmacy. [facility]

## 2012-09-12 ENCOUNTER — Telehealth: Payer: Self-pay | Admitting: Internal Medicine

## 2012-09-12 NOTE — Telephone Encounter (Signed)
Caller: [name]/Patient; Patient Name: Austin Rodriguez; PCP: Duncan Dull (Adults only); Best Callback Phone Number: [phone] Onset: 09/12/12 Afbrile 1800 Started with flank,  penis, scrotal pain, along with burning with urination. Took 3 500mg  Tylenol with pain improvement noted.  Pain currently 4/10 however was a 8/10 prior to Tylenol.  Urgent symptom of "Urinary tract symptoms and any flank or low back, penis or scrotal pain" present per Urinary Symptoms - Male protocol.  Disposition see in 24 hours.  No appointments available within 24 hour window.    OFFICE NOTE: PLEASE FOLLOW UP WITH PATIENT FOR APPOINTMENT WITHIN 24 HOURS.

## 2012-09-13 ENCOUNTER — Encounter: Payer: Self-pay | Admitting: Internal Medicine

## 2012-09-13 ENCOUNTER — Telehealth: Payer: Self-pay | Admitting: Internal Medicine

## 2012-09-13 ENCOUNTER — Ambulatory Visit (INDEPENDENT_AMBULATORY_CARE_PROVIDER_SITE_OTHER): Payer: Medicare Other | Admitting: Internal Medicine

## 2012-09-13 VITALS — BP 142/78 | HR 63 | Temp 97.6°F | Resp 12 | Ht 71.0 in | Wt 216.5 lb

## 2012-09-13 DIAGNOSIS — N342 Other urethritis: Secondary | ICD-10-CM

## 2012-09-13 DIAGNOSIS — R52 Pain, unspecified: Secondary | ICD-10-CM

## 2012-09-13 DIAGNOSIS — R109 Unspecified abdominal pain: Secondary | ICD-10-CM

## 2012-09-13 DIAGNOSIS — N400 Enlarged prostate without lower urinary tract symptoms: Secondary | ICD-10-CM

## 2012-09-13 DIAGNOSIS — R309 Painful micturition, unspecified: Secondary | ICD-10-CM

## 2012-09-13 DIAGNOSIS — R102 Pelvic and perineal pain: Secondary | ICD-10-CM | POA: Insufficient documentation

## 2012-09-13 DIAGNOSIS — R300 Dysuria: Secondary | ICD-10-CM

## 2012-09-13 DIAGNOSIS — R3911 Hesitancy of micturition: Secondary | ICD-10-CM

## 2012-09-13 LAB — POCT URINALYSIS DIPSTICK
Bilirubin, UA: NEGATIVE
Glucose, UA: NEGATIVE
Leukocytes, UA: NEGATIVE
Nitrite, UA: NEGATIVE

## 2012-09-13 LAB — HM DIABETES FOOT EXAM: HM Diabetic Foot Exam: NORMAL

## 2012-09-13 MED ORDER — CIPROFLOXACIN HCL 500 MG PO TABS: 500.0000 mg | ORAL_TABLET | Freq: Two times a day (BID) | ORAL | Status: DC

## 2012-09-13 MED ORDER — TAMSULOSIN HCL 0.4 MG PO CAPS: 0.4000 mg | ORAL_CAPSULE | Freq: Every day | ORAL | Status: DC

## 2012-09-13 NOTE — Telephone Encounter (Signed)
Patient aware of appointment at [facility] on 11.13.13. At 4:00 will arrive at 3:45 . No prep.

## 2012-09-13 NOTE — Telephone Encounter (Signed)
I have scheduled patient an appointment with Dr. Darrick Huntsman this morning at 9:30.

## 2012-09-13 NOTE — Progress Notes (Signed)
Patient ID: Austin Rodriguez, male   DOB: 04-18-1946, 66 y.o.   MRN: [id number]  Patient Active Problem List  Diagnosis  . Screening for colon cancer  . Cervicalgia  . Lumbago with sciatica  . Screening for prostate cancer  . Hyperlipidemia  . Hypertension  . Depression  . Anxiety  . Back pain of thoracolumbar region  . Diabetes mellitus type 2, diet-controlled  . Other abnormal blood chemistry  . Encounter for preventive health examination  . Atypical chest pain  . Plantar warts  . Acute suprapubic pain  . BPH (benign prostatic hypertrophy)  . Urethritis, unspecified    Subjective:  CC:   Chief Complaint  Patient presents with  . Dysuria    HPI:   [name] a 66 y.o. male who presents Increased urination for 5 days,  Sunday night developed pain in the urethra with voiding which resolved same evening but has been having increased scrotal and low back pain .voiding small amounts often. No recent travel or use of narcotics except tramadol.  No fevers, some nausea.  No sweats.  Bowels are moving daily , no diarrhea.     Past Medical History  Diagnosis Date  . cervical spondylosis   . Diabetes mellitus   . Chronic neck pain   . Hyperlipidemia   . Hypertension   . Depression   . Anxiety   . BPH (benign prostatic hypertrophy)     Past Surgical History  Procedure Date  . Spine surgery   . Carpal tunnel release March 2013    [name]  . Hernia repair     left inguinal         The following portions of the patient's history were reviewed and updated as appropriate: Allergies, current medications, and problem list.    Review of Systems:   12 Pt  review of systems was negative except those addressed in the HPI,     History   Social History  . Marital Status: Married    Spouse Name: Austin Rodriguez    Number of Children: N/A  . Years of Education: N/A   Occupational History  . die cutter operator    Social History Main Topics  .  Smoking status: Never Smoker   . Smokeless tobacco: Never Used  . Alcohol Use: No  . Drug Use: No  . Sexually Active: Not on file   Other Topics Concern  . Not on file   Social History Narrative  . No narrative on file    Objective:  BP 142/78  Pulse 63  Temp 97.6 F (36.4 C) (Oral)  Resp 12  Ht 5\' 11"  (1.803 m)  Wt 216 lb 8 oz (98.204 kg)  BMI 30.20 kg/m2  SpO2 98%  General appearance: alert, cooperative and appears stated age Neck: no adenopathy, no carotid bruit, supple, symmetrical, trachea midline and thyroid not enlarged, symmetric, no tenderness/mass/nodules Back: symmetric, no curvature. ROM normal. No CVA tenderness. Lungs: clear to auscultation bilaterally Heart: regular rate and rhythm, S1, S2 normal, no murmur, click, rub or gallop Abdomen: soft, tender in bilatera lower quadrants,  bowel sounds normal; no masses,  no organomegaly Pulses: 2+ and symmetric Skin: Skin color, texture, turgor normal. No rashes or lesions Lymph nodes: Cervical, supraclavicular, and axillary nodes normal.  Assessment and Plan:  Urethritis, unspecified Empiric treatmet with ciprofloxacin for two weeks  BPH (benign prostatic hypertrophy) Confirmed with kidney ultrasound, with right renal pelviectasis.  Post void residual was unchanged from pre  void volume at 67 ml. Urology referral offered.   Updated Medication List Outpatient Encounter Prescriptions as of 09/13/2012  Medication Sig Dispense Refill  . ALPRAZolam (XANAX) 0.5 MG tablet TAKE 1 TABLET DAILY AS NEEDED FOR ANXIETY  30 tablet  3  . aspirin 81 MG tablet Take 81 mg by mouth daily.        . cholecalciferol (VITAMIN D) 1000 UNITS tablet Take 1,000 Units by mouth daily.        . citalopram (CELEXA) 20 MG tablet Take 1 tablet (20 mg total) by mouth daily.  30 tablet  11  . fexofenadine (ALLEGRA) 180 MG tablet Take 1 tablet (180 mg total) by mouth daily.  30 tablet  5  . fish oil-omega-3 fatty acids 1000 MG capsule Take 2 g  by mouth daily.        Marland Kitchen gabapentin (NEURONTIN) 300 MG capsule Take 2 capsules (600 mg total) by mouth 3 (three) times daily.  540 capsule  3  . losartan (COZAAR) 100 MG tablet Take 1 tablet (100 mg total) by mouth daily.  90 tablet  3  . Multiple Vitamin (MULTIVITAMIN) tablet Take 1 tablet by mouth daily.        . simvastatin (ZOCOR) 20 MG tablet Take 1 tablet (20 mg total) by mouth every evening.  90 tablet  2  . traMADol (ULTRAM) 50 MG tablet Take 50 mg by mouth every 6 (six) hours as needed.      . traMADol (ULTRAM) 50 MG tablet TAKE 1 TABLET BY MOUTH EVERY 6 HOURS AS NEEDED FOR PAIN.  90 tablet  2  . Blood Glucose Monitoring Suppl (BLOOD GLUCOSE METER) kit Use as instructed  1 each  0  . ciprofloxacin (CIPRO) 500 MG tablet Take 1 tablet (500 mg total) by mouth 2 (two) times daily.  28 tablet  0  . glucose blood test strip Use as instructed  100 each  12  . Imiquimod 3.75 % CREA Apply daily in the evening to affected areas after soaking hands in warm water for 15 minutes  28 each  0  . Lancets MISC Patient test blood sugar two times a day.  100 each  5  . scopolamine (TRANSDERM-SCOP) 1.5 MG Place 1 patch (1.5 mg total) onto the skin every 3 (three) days.  10 patch  0  . Tamsulosin HCl (FLOMAX) 0.4 MG CAPS Take 1 capsule (0.4 mg total) by mouth daily.  30 capsule  3  . zoster vaccine live, PF, (ZOSTAVAX) 16109 UNT/0.65ML injection Inject 19,400 Units into the skin once.  1 vial  0     Orders Placed This Encounter  Procedures  . Urine culture  . Measure post void residual  . Urinalysis Dipstick    No Follow-up on file.

## 2012-09-13 NOTE — Patient Instructions (Addendum)
I am treating your for prostatitis with ciprofloxacin twice daily for 2 weeks  I am ordering an ultrasound of your bladder to make sure it is emptying appropriately  I am adding Flomax to help you void

## 2012-09-14 ENCOUNTER — Ambulatory Visit: Payer: Self-pay | Admitting: Internal Medicine

## 2012-09-14 ENCOUNTER — Telehealth (INDEPENDENT_AMBULATORY_CARE_PROVIDER_SITE_OTHER): Payer: Medicare Other | Admitting: Internal Medicine

## 2012-09-14 DIAGNOSIS — R339 Retention of urine, unspecified: Secondary | ICD-10-CM

## 2012-09-14 NOTE — Telephone Encounter (Signed)
Terri from [facility] is calling concerning the order that was sent to her on the Kidney Ultrasound for pt. The order needs to say Kidney Ultrasound, the order sent said something completely different. She says what was put on the order was more of a diagnosis instead of saying kidney Ultrasound ???. Terri [phone] Fax [phone]

## 2012-09-15 ENCOUNTER — Encounter: Payer: Self-pay | Admitting: Internal Medicine

## 2012-09-15 ENCOUNTER — Telehealth: Payer: Self-pay | Admitting: Internal Medicine

## 2012-09-15 DIAGNOSIS — N400 Enlarged prostate without lower urinary tract symptoms: Secondary | ICD-10-CM | POA: Insufficient documentation

## 2012-09-15 DIAGNOSIS — N342 Other urethritis: Secondary | ICD-10-CM | POA: Insufficient documentation

## 2012-09-15 LAB — URINE CULTURE
Colony Count: NO GROWTH
Organism ID, Bacteria: NO GROWTH

## 2012-09-15 NOTE — Telephone Encounter (Signed)
His ultrasound showed a little bit of urinary retention (60 ml) due to enlarged prostate , and the right kidney was slightly enlarged.  I would like him to see a urologist.  Does he have a preference?

## 2012-09-15 NOTE — Telephone Encounter (Signed)
Patient notified via phone of lab results, he stated that he does not have a preference for urologist.

## 2012-09-15 NOTE — Assessment & Plan Note (Signed)
Confirmed with kidney ultrasound, with right renal pelviectasis.  Post void residual was unchanged from pre void volume at 67 ml. Urology referral offered.

## 2012-09-15 NOTE — Assessment & Plan Note (Signed)
Empiric treatmet with ciprofloxacin for two weeks

## 2012-10-05 ENCOUNTER — Telehealth: Payer: Self-pay | Admitting: Internal Medicine

## 2012-10-05 NOTE — Telephone Encounter (Signed)
Patient thinks a mistake has been made on a couple of his prescriptions  Antibiotic - Tamsulosim 30 day prescription with1 refill he thinks he will not need anymore after this dose and Flomax for 28 days with no refill which he will need more refills after taking .

## 2012-10-05 NOTE — Telephone Encounter (Signed)
tamsulosin IS Flomax,  And is not an antibiotic,  It is a prostate medication

## 2012-10-06 IMAGING — CT CT CERVICAL SPINE W/ CM
3 of 4 series · 14 of 28 positions shown, 16 images · IV contrast (omnipaque)
Comparison: None.

CLINICAL DATA: Low back pain extending into the right lower
extremity.  Neck pain with radiation into both lower extremities.
Parasthesias involving index and middle fingers.

MYELOGRAM INJECTION
TECHNIQUE: Informed consent was obtained from the patient prior to
the procedure, including potential complications of headache,
allergy, infection and pain.  A timeout procedure was performed.
With the patient prone, the lower back was prepped with Betadine.
1% Lidocaine was used for local anesthesia.  Lumbar puncture was
performed at the left paramidline L1-2 level using a 22 gauge
needle with return of clear CSF.  10 ml of Omnipaque 900was
injected into the subarachnoid space .
TECHNIQUE: Following injection of intrathecal Omnipaque contrast,
spine imaging in multiple projections was performed using
fluoroscopy.
Fluoroscopy Time: 1.26 minutes.
TECHNIQUE: CT imaging of the cervical spine was performed after
intrathecal contrast administration. Multiplanar CT image
reconstructions were also generated.
TECHNIQUE: CT imaging of the lumbar spine was performed after
intrathecal contrast administration.  Multiplanar CT image

[Series 2: c spine bone · axial · 0.27mm/px · z∈[-14,+106]mm · 5 of 73 slices shown, 7 images]
[im 13/73  soft-tissue]
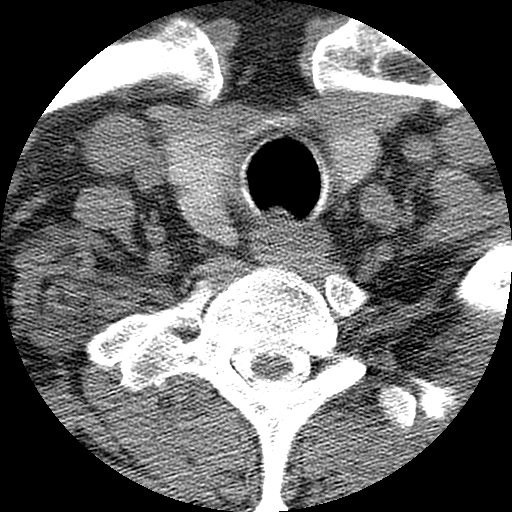
[im 13/73  bone]
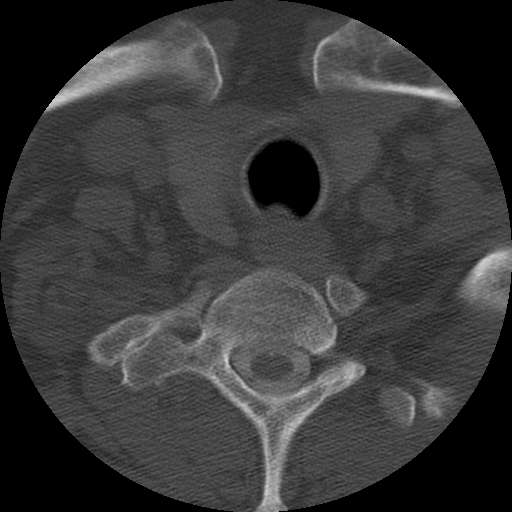
[im 25/73  bone]
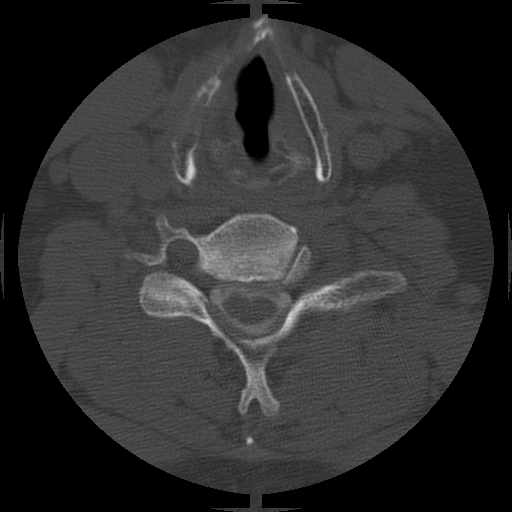
[im 37/73  bone]
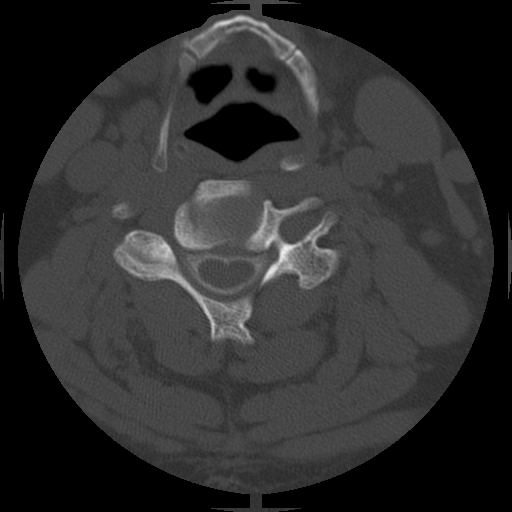
[im 49/73  bone]
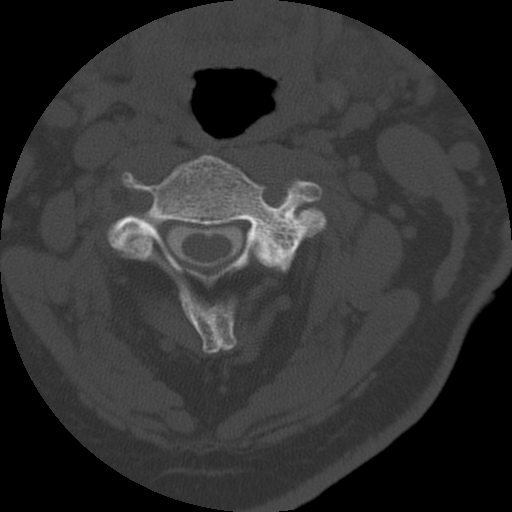
[im 61/73  soft-tissue]
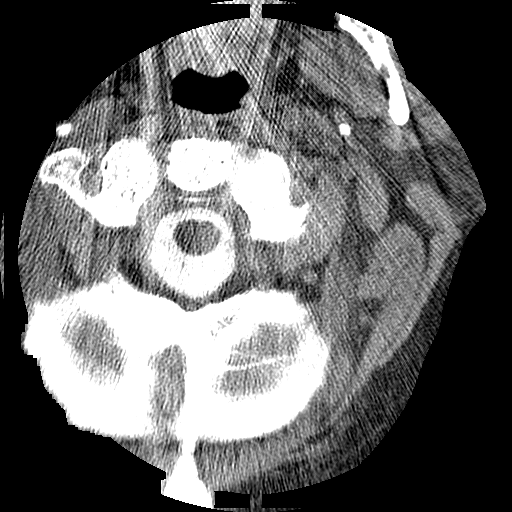
[im 61/73  bone]
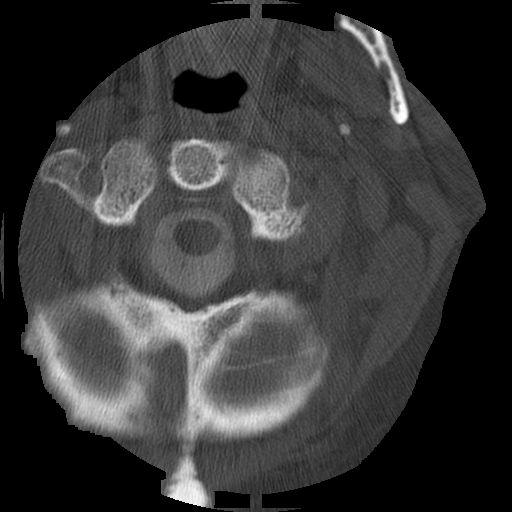

[Series 3: c spine soft · axial · 0.27mm/px · z∈[-11,+49]mm · 3 of 71 slices shown]
[im 12/71  soft-tissue]
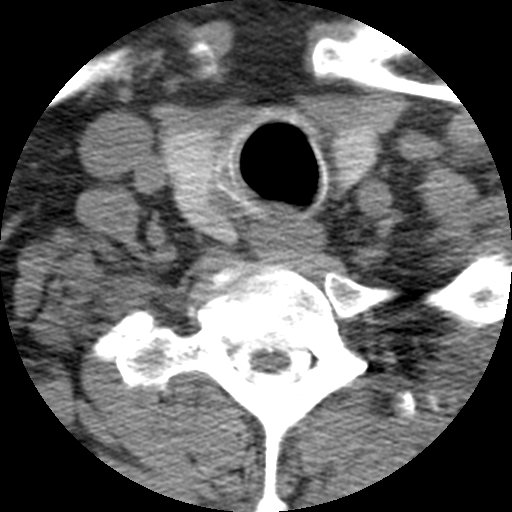
[im 24/71  soft-tissue]
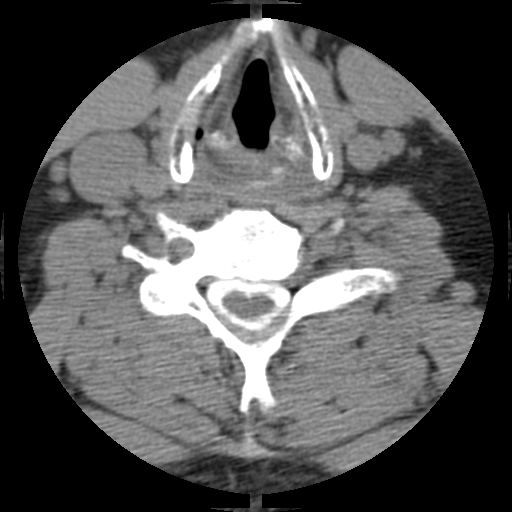
[im 36/71  soft-tissue]
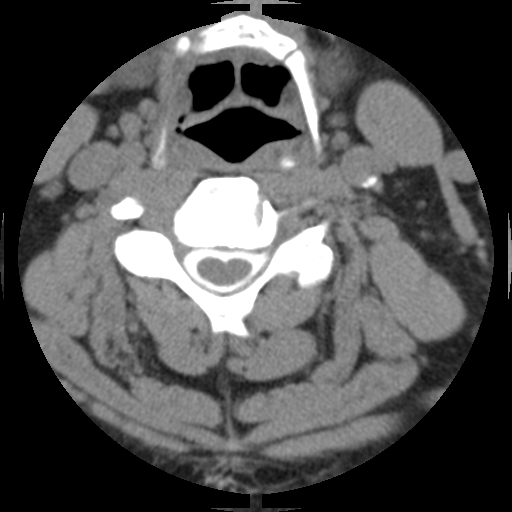

[Series 400: cor · coronal · 0.36mm/px · 6 of 39 slices shown]
[im 2/39  soft-tissue]
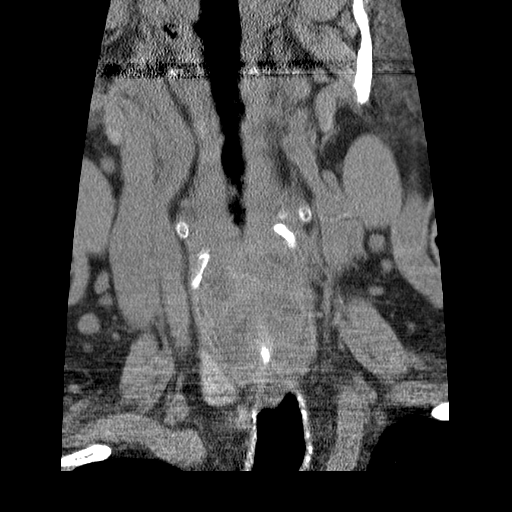
[im 7/39  bone]
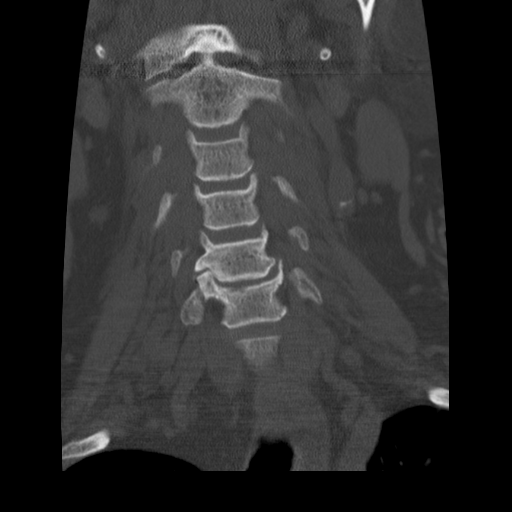
[im 13/39  bone]
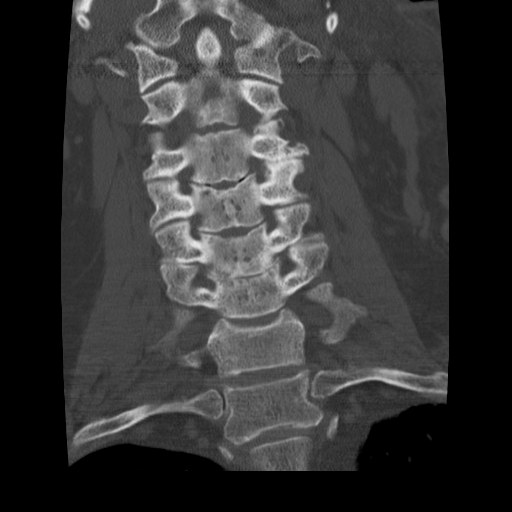
[im 20/39  bone]
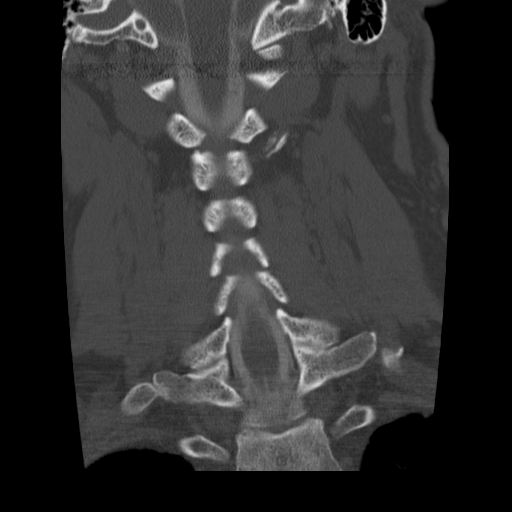
[im 26/39  bone]
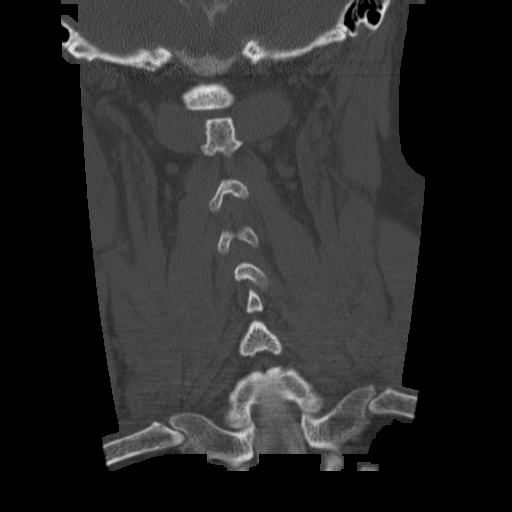
[im 32/39  bone]
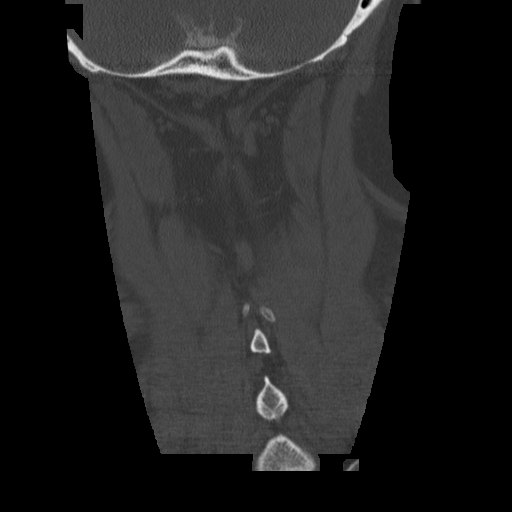

[14 of 28 positions shown; findings below may reference images not displayed]

IMPRESSION: Successful injection of  intrathecal contrast for myelography.

MYELOGRAM CERVICAL AND LUMBAR
FINDINGS: Five lumbar-type vertebral bodies are present.  The
vertebral body heights are maintained.  Slight anterolisthesis at
L3-4 is stable through a full range of flexion and extension.
Central canal narrowing is present at L4-5 and L3-4.  Lateral
recess narrowing is worse on the right at L4-5.  There is an
impression on the ventral CSF space at L3-4 without significant
deviation of the nerve roots.  The thecal sac is tapered at L5-S1.
There is no significant change in disc disease with standing,
flexion, or extension.

A prominent disc osteophyte complex is present at C3-4, C4-5, and
C5-6.  There is significant blunting of the nerve roots there is
significant nerve root blunting bilaterally at each of these
levels.
IMPRESSION: Significant disc osteophyte complex with biforaminal narrowing
and central canal stenosis at C3-4, C4-5, and C5-6.  Please see the
CT report below for details.


CT MYELOGRAPHY CERVICAL SPINE
FINDINGS: The cervical spine is imaged from the skull base through
the midbody of T2.  The craniocervical junction is within normal
limits.  Vertebral body heights and alignment are maintained.  The
soft tissues are unremarkable.  The individual disc levels are as
follows.

C2-3:  Facet hypertrophy is prominent on the left.  There is no
significant stenosis.  Slight disc bulging is evident.

C3-4:  A combination of uncovertebral disease and facet hypertrophy
results in moderate left foraminal stenosis.  There is mild
effacement of the ventral CSF by a broad-based disc osteophyte
complex.

C4-5:  A broad-based disc bulge is present.  Mild right foraminal
narrowing is due to facet hypertrophy.  Mild left foraminal
narrowing may be exacerbated by a soft disc bulge.

C5-6:  Bilateral uncovertebral and facet disease is noted.  Mild to
moderate central canal stenosis is present.  Moderate foraminal
stenosis is worse on the right.

C6-7:  A broad-based disc osteophyte complex is present.  The
foramina are patent bilaterally.

C7-T1:  Negative.
IMPRESSION: 1.  Multilevel  spondylosis is greatest at C5-6 where moderate
foraminal stenosis is worse on the right.
2.  Mild to moderate central canal stenosis C5-6.
3.  Mild foraminal narrowing bilaterally at C4-5.
4.  Moderate left foraminal stenosis at C3-4.
5.  Asymmetric left-sided facet hypertrophy at C2-3.
6.  Mild disc bulging at C6-7 without focal stenosis.


CT MYELOGRAPHY LUMBAR SPINE
FINDINGS: The conus medullaris terminates at L1.  The vertebral
body heights and alignment maintained.  There is straightening of
the normal cervical lordosis.  The individual disc levels are as
follows.

T12-L1:  Negative.

L1-2:  Mild facet hypertrophy is present.  There is no focal
stenosis.

L2-3:  A broad-based disc bulge is asymmetric to the left.  Facet
hypertrophy is also worse on the left.  This results in mild left
lateral recess narrowing.  The foramina are patent bilaterally.

L3-4:  A broad-based disc bulge is present.  Moderate facet
hypertrophy is noted.  Lateral recess narrowing is worse on the
left.  Mild foraminal narrowing is also worse on the left.  The
patient is status post laminectomy.

L4-5:  A broad-based disc bulge is present.  Moderate facet
hypertrophy is noted.  The patient is status post laminectomy.
Moderate osseous foraminal stenosis is worse on the right.  Lateral
recess narrowing is seen bilaterally.

L5-S1:  A broad-based disc bulge is present.  Moderate foraminal
stenosis is worse on the right.  The patient is status post
laminectomy.  The distal thecal sac is tapered, compatible with
epidural lipomatosis.

Degenerative changes are evident in the SI joints bilaterally.
IMPRESSION: 1.  Status post laminectomy at L3-4, L4-5, L5-S1.
2.  Lateral recess and foraminal narrowing is worse on the left at
L3-4.
3.  Lateral recess and foraminal narrowing is worse on the right at
L4-5 and L5-S1.
4.  Mild left lateral recess narrowing at L2-3.

## 2012-10-07 NOTE — Telephone Encounter (Signed)
Spoke to patient spouse she stated that they were clear on the Rx.

## 2012-10-24 ENCOUNTER — Other Ambulatory Visit: Payer: Self-pay | Admitting: Internal Medicine

## 2012-10-24 NOTE — Telephone Encounter (Signed)
Gabapentin 300 mg capsule  Take 2 capsule (600 mg total ) by mouth 3 times daily

## 2012-10-25 MED ORDER — GABAPENTIN 300 MG PO CAPS: 600.0000 mg | ORAL_CAPSULE | Freq: Three times a day (TID) | ORAL | Status: DC

## 2012-11-06 LAB — HM DIABETES EYE EXAM: HM Diabetic Eye Exam: NORMAL

## 2012-11-07 ENCOUNTER — Ambulatory Visit (INDEPENDENT_AMBULATORY_CARE_PROVIDER_SITE_OTHER): Payer: Medicare Other | Admitting: Internal Medicine

## 2012-11-07 ENCOUNTER — Encounter: Payer: Self-pay | Admitting: Internal Medicine

## 2012-11-07 VITALS — BP 140/76 | HR 67 | Temp 97.7°F | Resp 16 | Wt 220.2 lb

## 2012-11-07 DIAGNOSIS — R52 Pain, unspecified: Secondary | ICD-10-CM

## 2012-11-07 DIAGNOSIS — E119 Type 2 diabetes mellitus without complications: Secondary | ICD-10-CM

## 2012-11-07 DIAGNOSIS — B070 Plantar wart: Secondary | ICD-10-CM

## 2012-11-07 LAB — HM DIABETES FOOT EXAM

## 2012-11-07 MED ORDER — NABUMETONE 500 MG PO TABS: 500.0000 mg | ORAL_TABLET | Freq: Two times a day (BID) | ORAL | Status: DC

## 2012-11-07 NOTE — Assessment & Plan Note (Signed)
Will postpone labs for a few weeks given recent indulgences. Reminded to get diabetic eye exam ASAP.  [facility].  foott exam normal today except for dystrophic right great toe nail secondary to remote trauma.

## 2012-11-07 NOTE — Patient Instructions (Addendum)
Pease stop zocor  For 6 weeks (simvastatin).  You can use relafen (nabumetone) twice daily with food and tramadol 50 to 100 mg three times daily .    Try massage,  Hot shower in the  Morning.   Lets hold off on doing your labs for 6 weeks  (so we can assess need to resume zocor assuming that your pain is better after stopping)  We can try a muscle relaxer  At bedtime if needed   I promise you send you the fish batter recipe  Get your  Eyes examined

## 2012-11-07 NOTE — Assessment & Plan Note (Signed)
Left hand, prior dermatology evaluation but no removal done. No improvement yet with the imiquimod but he just started it.

## 2012-11-07 NOTE — Progress Notes (Signed)
Patient ID: Austin Rodriguez, male   DOB: 09-14-1946, 67 y.o.   MRN: [id number]  Patient Active Problem List  Diagnosis  . Screening for colon cancer  . Lumbago with sciatica  . Screening for prostate cancer  . Hyperlipidemia  . Hypertension  . Depression  . Anxiety  . Diabetes mellitus type 2, diet-controlled  . Other abnormal blood chemistry  . Encounter for preventive health examination  . Atypical chest pain  . Plantar warts  . BPH (benign prostatic hypertrophy)  . Urethritis, unspecified  . Diffuse pain    Subjective:  CC:   Chief Complaint  Patient presents with  . Follow-up    HPI:   Austin Rodriguez a 67 y.o. male who presents for 3 month follow up on diabetes and hyperlipidemia.  Gained 5 lbs over the holidays due to overindulgence and altered exercise regimen but getting back on track.  CC today Diffuse pain .  Wakes up with everything hurting, chest back and arm pain.  Not like the pain when he had cervical spinal disk herniation.  Pain is worse in the AM and improves with time. He had previously weaned himself off of chronic use of percocet over 3 months ago after his spine surgery. No relief with tramadol and tylenol.  Has not tried NSAIDs,.  No dyspnea, nausea, cough or edema.    Past Medical History  Diagnosis Date  . cervical spondylosis   . Diabetes mellitus   . Chronic neck pain   . Hyperlipidemia   . Hypertension   . Depression   . Anxiety   . BPH (benign prostatic hypertrophy)     Past Surgical History  Procedure Date  . Spine surgery   . Carpal tunnel release March 2013    [name]  . Hernia repair     left inguinal         The following portions of the patient's history were reviewed and updated as appropriate: Allergies, current medications, and problem list.    Review of Systems:   12 Pt  review of systems was negative except those addressed in the HPI,     History   Social History  . Marital Status: Married   Spouse Name: Junious Dresser Gillies    Number of Children: N/A  . Years of Education: N/A   Occupational History  . die cutter operator    Social History Main Topics  . Smoking status: Never Smoker   . Smokeless tobacco: Never Used  . Alcohol Use: No  . Drug Use: No  . Sexually Active: Not on file   Other Topics Concern  . Not on file   Social History Narrative  . No narrative on file    Objective:  BP 140/76  Pulse 67  Temp 97.7 F (36.5 C) (Oral)  Resp 16  Wt 220 lb 4 oz (99.905 kg)  SpO2 96%  General appearance: alert, cooperative and appears stated age Ears: normal TM's and external ear canals both ears Throat: lips, mucosa, and tongue normal; teeth and gums normal Neck: no adenopathy, no carotid bruit, supple, symmetrical, trachea midline and thyroid not enlarged, symmetric, no tenderness/mass/nodules Back: symmetric, no curvature. ROM normal. No CVA tenderness. Lungs: clear to auscultation bilaterally Heart: regular rate and rhythm, S1, S2 normal, no murmur, click, rub or gallop Abdomen: soft, non-tender; bowel sounds normal; no masses,  no organomegaly Pulses: 2+ and symmetric Skin: Skin color, texture, turgor normal. [place] warts left hand,  Lymph nodes: Cervical, supraclavicular, and  axillary nodes normal. Foot exam:  Nails are well trimmed,  Mild callous formation . Pulses excellent  Sensation intact to microfilament in 12 of 12 locations.  Assessment and Plan:  Diffuse pain Etiology unclear.  Advised to suspend zocor for 6 weeks to see if pain resolves.  Trial of relafen and tramadol.   Diabetes mellitus type 2, diet-controlled Will postpone labs for a few weeks given recent indulgences. Reminded to get diabetic eye exam ASAP.  [facility].  foott exam normal today except for dystrophic right great toe nail secondary to remote trauma.   Plantar warts Left hand, prior dermatology evaluation but no removal done. No improvement yet with the imiquimod but he just  started it.    Updated Medication List Outpatient Encounter Prescriptions as of 11/07/2012  Medication Sig Dispense Refill  . ALPRAZolam (XANAX) 0.5 MG tablet TAKE 1 TABLET DAILY AS NEEDED FOR ANXIETY  30 tablet  3  . aspirin 81 MG tablet Take 81 mg by mouth daily.        . cholecalciferol (VITAMIN D) 1000 UNITS tablet Take 1,000 Units by mouth daily.        . citalopram (CELEXA) 20 MG tablet Take 1 tablet (20 mg total) by mouth daily.  30 tablet  11  . fexofenadine (ALLEGRA) 180 MG tablet Take 1 tablet (180 mg total) by mouth daily.  30 tablet  5  . fish oil-omega-3 fatty acids 1000 MG capsule Take 2 g by mouth daily.        Marland Kitchen gabapentin (NEURONTIN) 300 MG capsule Take 2 capsules (600 mg total) by mouth 3 (three) times daily.  540 capsule  3  . Imiquimod 3.75 % CREA Apply daily in the evening to affected areas after soaking hands in warm water for 15 minutes  28 each  0  . Lancets MISC Patient test blood sugar two times a day.  100 each  5  . losartan (COZAAR) 100 MG tablet Take 1 tablet (100 mg total) by mouth daily.  90 tablet  3  . Multiple Vitamin (MULTIVITAMIN) tablet Take 1 tablet by mouth daily.        Marland Kitchen scopolamine (TRANSDERM-SCOP) 1.5 MG Place 1 patch (1.5 mg total) onto the skin every 3 (three) days.  10 patch  0  . Tamsulosin HCl (FLOMAX) 0.4 MG CAPS Take 1 capsule (0.4 mg total) by mouth daily.  30 capsule  3  . traMADol (ULTRAM) 50 MG tablet TAKE 1 TABLET BY MOUTH EVERY 6 HOURS AS NEEDED FOR PAIN.  90 tablet  2  . zoster vaccine live, PF, (ZOSTAVAX) 16109 UNT/0.65ML injection Inject 19,400 Units into the skin once.  1 vial  0  . [DISCONTINUED] ciprofloxacin (CIPRO) 500 MG tablet Take 1 tablet (500 mg total) by mouth 2 (two) times daily.  28 tablet  0  . [DISCONTINUED] simvastatin (ZOCOR) 20 MG tablet Take 1 tablet (20 mg total) by mouth every evening.  90 tablet  2  . [DISCONTINUED] traMADol (ULTRAM) 50 MG tablet Take 50 mg by mouth every 6 (six) hours as needed.      . nabumetone  (RELAFEN) 500 MG tablet Take 1 tablet (500 mg total) by mouth 2 (two) times daily.  60 tablet  1     Orders Placed This Encounter  Procedures  . [facility] DIABETES EYE EXAM  . [facility] DIABETES FOOT EXAM    No Follow-up on file.

## 2012-11-07 NOTE — Assessment & Plan Note (Signed)
Etiology unclear.  Advised to suspend zocor for 6 weeks to see if pain resolves.  Trial of relafen and tramadol.

## 2012-12-09 ENCOUNTER — Telehealth: Payer: Self-pay | Admitting: *Deleted

## 2012-12-09 DIAGNOSIS — E119 Type 2 diabetes mellitus without complications: Secondary | ICD-10-CM

## 2012-12-09 NOTE — Telephone Encounter (Signed)
Pt is coming in for labs on Monday 02.10.2014 what labs and dx would you like? Thank you  

## 2012-12-09 NOTE — Addendum Note (Signed)
Addended by: Sherlene Shams on: 12/09/2012 05:25 PM   Modules accepted: Orders

## 2012-12-09 NOTE — Telephone Encounter (Signed)
Labs are in thanks

## 2012-12-12 ENCOUNTER — Other Ambulatory Visit (INDEPENDENT_AMBULATORY_CARE_PROVIDER_SITE_OTHER): Payer: Medicare Other

## 2012-12-12 DIAGNOSIS — E119 Type 2 diabetes mellitus without complications: Secondary | ICD-10-CM

## 2012-12-12 LAB — COMPREHENSIVE METABOLIC PANEL
ALT: 20 U/L (ref 0–53)
BUN: 27 mg/dL — ABNORMAL HIGH (ref 6–23)
CO2: 23 mEq/L (ref 19–32)
Calcium: 9.2 mg/dL (ref 8.4–10.5)
Chloride: 107 mEq/L (ref 96–112)
Creatinine, Ser: 1.4 mg/dL (ref 0.4–1.5)
GFR: 55.73 mL/min — ABNORMAL LOW (ref >60.00)
Glucose, Bld: 95 mg/dL (ref 70–99)
Total Bilirubin: 0.8 mg/dL (ref 0.3–1.2)

## 2012-12-12 LAB — HEMOGLOBIN A1C: Hgb A1c MFr Bld: 5.2 % (ref 4.6–6.5)

## 2012-12-16 ENCOUNTER — Emergency Department: Payer: Self-pay | Admitting: Emergency Medicine

## 2012-12-16 LAB — CBC WITH DIFFERENTIAL/PLATELET
Basophil #: 0.1 10*3/uL (ref 0.0–0.1)
Eosinophil %: 7.7 %
Lymphocyte %: 26.8 %
MCH: 32.5 pg (ref 26.0–34.0)
MCHC: 34.9 g/dL (ref 32.0–36.0)
Monocyte %: 5.5 %
Neutrophil #: 4.1 10*3/uL (ref 1.4–6.5)
Neutrophil %: 58.9 %
RBC: 4.82 10*6/uL (ref 4.40–5.90)
RDW: 13.8 % (ref 11.5–14.5)
WBC: 7 10*3/uL (ref 3.8–10.6)

## 2012-12-16 LAB — COMPREHENSIVE METABOLIC PANEL
Bilirubin,Total: 0.7 mg/dL (ref 0.2–1.0)
Chloride: 111 mmol/L — ABNORMAL HIGH (ref 98–107)
Creatinine: 1.35 mg/dL — ABNORMAL HIGH (ref 0.60–1.30)
EGFR (African American): >60
Sodium: 142 mmol/L (ref 136–145)

## 2012-12-16 LAB — URINALYSIS, COMPLETE
Bacteria: NONE SEEN
Hyaline Cast: 1
Ketone: NEGATIVE
Nitrite: NEGATIVE
Protein: 75
Specific Gravity: 1.025 (ref 1.003–1.030)
Squamous Epithelial: <1
WBC UR: 1 /HPF (ref 0–5)

## 2012-12-19 ENCOUNTER — Ambulatory Visit (INDEPENDENT_AMBULATORY_CARE_PROVIDER_SITE_OTHER): Payer: Medicare Other | Admitting: Internal Medicine

## 2012-12-19 ENCOUNTER — Telehealth: Payer: Self-pay | Admitting: Internal Medicine

## 2012-12-19 ENCOUNTER — Encounter: Payer: Self-pay | Admitting: Internal Medicine

## 2012-12-19 VITALS — BP 140/72 | HR 73 | Temp 97.7°F | Resp 16 | Wt 222.2 lb

## 2012-12-19 DIAGNOSIS — E669 Obesity, unspecified: Secondary | ICD-10-CM

## 2012-12-19 DIAGNOSIS — I10 Essential (primary) hypertension: Secondary | ICD-10-CM

## 2012-12-19 DIAGNOSIS — E785 Hyperlipidemia, unspecified: Secondary | ICD-10-CM

## 2012-12-19 DIAGNOSIS — N3289 Other specified disorders of bladder: Secondary | ICD-10-CM

## 2012-12-19 DIAGNOSIS — N1330 Unspecified hydronephrosis: Secondary | ICD-10-CM

## 2012-12-19 DIAGNOSIS — R52 Pain, unspecified: Secondary | ICD-10-CM

## 2012-12-19 DIAGNOSIS — M5440 Lumbago with sciatica, unspecified side: Secondary | ICD-10-CM

## 2012-12-19 DIAGNOSIS — N200 Calculus of kidney: Secondary | ICD-10-CM

## 2012-12-19 DIAGNOSIS — M5430 Sciatica, unspecified side: Secondary | ICD-10-CM

## 2012-12-19 DIAGNOSIS — N342 Other urethritis: Secondary | ICD-10-CM

## 2012-12-19 DIAGNOSIS — Z87442 Personal history of urinary calculi: Secondary | ICD-10-CM

## 2012-12-19 MED ORDER — TRAMADOL HCL 50 MG PO TABS: ORAL_TABLET | ORAL | Status: DC

## 2012-12-19 MED ORDER — CYCLOBENZAPRINE HCL 10 MG PO TABS: 10.0000 mg | ORAL_TABLET | Freq: Three times a day (TID) | ORAL | Status: DC | PRN

## 2012-12-19 MED ORDER — OXYCODONE-ACETAMINOPHEN 10-325 MG PO TABS: 1.0000 | ORAL_TABLET | Freq: Four times a day (QID) | ORAL | Status: DC | PRN

## 2012-12-19 MED ORDER — TRAMADOL HCL 50 MG PO TABS: 50.0000 mg | ORAL_TABLET | Freq: Three times a day (TID) | ORAL | Status: DC | PRN

## 2012-12-19 MED ORDER — PREDNISONE 20 MG PO TABS: 60.0000 mg | ORAL_TABLET | Freq: Every day | ORAL | Status: DC

## 2012-12-19 NOTE — Assessment & Plan Note (Signed)
He has bladder wall thickening on recent CT which may be due to recent urethritis and has requested urology consult

## 2012-12-19 NOTE — Assessment & Plan Note (Signed)
Her BMI remains unchanged despite diet and exercise. We discussed the nature and quality of her exercises well as of her diet. Usually what I find is that people are not exercising as vigorously as they should to achieve a sustained heart rate in the aerobic zone. I suggested that he considerusing a personal trainer to help guide his efforts.   I also am advising him to keep a food diary for 3 days to assess dietary complaince.

## 2012-12-19 NOTE — Assessment & Plan Note (Signed)
Reinjury.,  Steroids, pain relievers,  Behavior modification. Negative straight leg lift

## 2012-12-19 NOTE — Assessment & Plan Note (Addendum)
Statin suspended due to diffuse body aches which have resolved with suspension of zocor. LDL was 82 prior to stopping staitn. Will repeat lipids in 3 months.

## 2012-12-19 NOTE — Progress Notes (Signed)
Patient ID: Austin Rodriguez, male   DOB: [birth year], 67 y.o.   MRN: [id number] \ Patient Active Problem List  Diagnosis  . Screening for colon cancer  . Lumbago with sciatica  . Screening for prostate cancer  . Hyperlipidemia  . Hypertension  . Depression  . Anxiety  . Diabetes mellitus type 2, diet-controlled  . Other abnormal blood chemistry  . Encounter for preventive health examination  . Atypical chest pain  . Plantar warts  . BPH (benign prostatic hypertrophy)  . Urethritis, unspecified  . Diffuse pain  . Nephrolithiasis    Subjective:  CC:   Chief Complaint  Patient presents with  . Follow-up    6 week     HPI:   Austin Rodriguez is a 67 y.o. male who presents Follow up on acute and chronic issues:  Persistent back pain x 10 days.  [place] started with a feeling that he pulled muscle in back after lifting heavy groceries our of his car.  His pai nwas so severe that he has been treated twice in ED , first  For suspected kidney stone with bilateral stones and bladder wall thickening. CT noted but no hydronephrosis or ureteral obstruction.  He was sent home without medications other than flexeril.  He returned a second time and was given per percocet.  Can't find a position that is comfortable   Pain radiates to left thigh and testicle.  Injury occurred 10 days ago, .  History of lumbar surgery 9 yrs ago    Wants urology referal for stones and abnormal bladder wall thickening .  Was referred in Nov for BPH,  With right hydrnoephrosis and apptin  jan madebut it is unclear if patient kept appt.   He had fasting labs last week aftewr Zocor awas stopped for diffuse body aches,  Which have improved   Lipids were not done  Frsutrated at wt gain of 1 lb despite walking 4 miles daily and following a low carb diet     Past Medical History  Diagnosis Date  . cervical spondylosis   . Diabetes mellitus   . Chronic neck pain   . Hyperlipidemia   . Hypertension   . Depression    . Anxiety   . BPH (benign prostatic hypertrophy)     Past Surgical History  Procedure Laterality Date  . Spine surgery    . Carpal tunnel release  March 2013    Austin Rodriguez  . Hernia repair      left inguinal    The following portions of the patient's history were reviewed and updated as appropriate: Allergies, current medications, and problem list.    Review of Systems:  Patient denies headache, fevers, malaise, unintentional weight loss, skin rash, eye pain, sinus congestion and sinus pain, sore throat, dysphagia,  hemoptysis , cough, dyspnea, wheezing, chest pain, palpitations, orthopnea, edema, abdominal pain, nausea, melena, diarrhea, constipation, flank pain, dysuria, hematuria, urinary  Frequency, nocturia, numbness, tingling, seizures,  Focal weakness, Loss of consciousness,  Tremor, insomnia, depression, anxiety, and suicidal ideation.         History   Social History  . Marital Status: Married    Spouse Name: Austin Rodriguez    Number of Children: N/A  . Years of Education: N/A   Occupational History  . die cutter operator    Social History Main Topics  . Smoking status: Never Smoker   . Smokeless tobacco: Never Used  . Alcohol Use: No  . Drug Use:  No  . Sexually Active: Not on file   Other Topics Concern  . Not on file   Social History Narrative  . No narrative on file    Objective:  BP 140/72  Pulse 73  Temp(Src) 97.7 F (36.5 C) (Oral)  Resp 16  Wt 222 lb 4 oz (100.812 kg)  BMI 31.01 kg/m2  SpO2 97%  General appearance: alert, cooperative and appears stated age Ears: normal TM's and external ear canals both ears Throat: lips, mucosa, and tongue normal; teeth and gums normal Neck: no adenopathy, no carotid bruit, supple, symmetrical, trachea midline and thyroid not enlarged, symmetric, no tenderness/mass/nodules Back: symmetric, no curvature. ROM normal. No CVA tenderness. Lungs: clear to auscultation bilaterally Heart: regular rate  and rhythm, S1, S2 normal, no murmur, click, rub or gallop Abdomen: soft, non-tender; bowel sounds normal; no masses,  no organomegaly Pulses: 2+ and symmetric MSK: negative straight lef lift.  Skin: Skin color, texture, turgor normal. No rashes or lesions Lymph nodes: Cervical, supraclavicular, and axillary nodes normal.  Assessment and Plan:  Lumbago with sciatica  secondary to lifting without proper support,  Steroids, pain relievers,  Behavior modification. Negative straight leg lift  Hyperlipidemia Statin suspended due to diffuse body aches which have resolved with suspension of zocor. LDL was 82 prior to stopping staitn. Will repeat lipids in 3 months.   Hypertension Well controlled on current regimen. Renal function stable, no changes today.  Diffuse pain Resolved with suspension of statin .  Urethritis, unspecified He has bladder wall thickening on recent CT which may be due to recent urethritis and has requested urology consult   Nephrolithiasis bilateral by recent CT, nonobstructive.  Urology referral requested.  However, referral was made in November to Dr. Orson Slick for BPH,  appt per chart was Jan 6th, with patient made aware at time. But no mention of this today .  Will clarify.   Obesity (BMI 30.0-34.9) His BMI remains unchanged despite diet and exercise. We discussed the nature and quality of her exercises well as of her diet. Usually what I find is that people are not exercising as vigorously as they should to achieve a sustained heart rate in the aerobic zone. I suggested that he considerusing a personal trainer to help guide his efforts.   I also am advising him to keep a food diary for 3 days to assess dietary complaince.     Updated Medication List Outpatient Encounter Prescriptions as of 12/19/2012  Medication Sig Dispense Refill  . ALPRAZolam (XANAX) 0.5 MG tablet TAKE 1 TABLET DAILY AS NEEDED FOR ANXIETY  30 tablet  3  . aspirin 81 MG tablet Take 81 mg by mouth  daily.        . cholecalciferol (VITAMIN D) 1000 UNITS tablet Take 1,000 Units by mouth daily.        . citalopram (CELEXA) 20 MG tablet Take 1 tablet (20 mg total) by mouth daily.  30 tablet  11  . cyclobenzaprine (FLEXERIL) 10 MG tablet Take 1 tablet (10 mg total) by mouth 3 (three) times daily as needed.  90 tablet  1  . fexofenadine (ALLEGRA) 180 MG tablet Take 1 tablet (180 mg total) by mouth daily.  30 tablet  5  . fish oil-omega-3 fatty acids 1000 MG capsule Take 2 g by mouth daily.        Marland Kitchen gabapentin (NEURONTIN) 300 MG capsule Take 2 capsules (600 mg total) by mouth 3 (three) times daily.  540 capsule  3  .  Imiquimod 3.75 % CREA Apply daily in the evening to affected areas after soaking hands in warm water for 15 minutes  28 each  0  . Lancets MISC Patient test blood sugar two times a day.  100 each  5  . losartan (COZAAR) 100 MG tablet Take 1 tablet (100 mg total) by mouth daily.  90 tablet  3  . Multiple Vitamin (MULTIVITAMIN) tablet Take 1 tablet by mouth daily.        . nabumetone (RELAFEN) 500 MG tablet Take 1 tablet (500 mg total) by mouth 2 (two) times daily.  60 tablet  1  . scopolamine (TRANSDERM-SCOP) 1.5 MG Place 1 patch (1.5 mg total) onto the skin every 3 (three) days.  10 patch  0  . Tamsulosin HCl (FLOMAX) 0.4 MG CAPS Take 1 capsule (0.4 mg total) by mouth daily.  30 capsule  3  . traMADol (ULTRAM) 50 MG tablet TAKE 1 TABLET BY MOUTH EVERY 6 HOURS AS NEEDED FOR PAIN.  120 tablet  2  . zoster vaccine live, PF, (ZOSTAVAX) 16109 UNT/0.65ML injection Inject 19,400 Units into the skin once.  1 vial  0  . [DISCONTINUED] Cyclobenzaprine HCl (FLEXERIL PO) Take 1 tablet by mouth every 8 (eight) hours as needed (pain).      . [DISCONTINUED] traMADol (ULTRAM) 50 MG tablet TAKE 1 TABLET BY MOUTH EVERY 6 HOURS AS NEEDED FOR PAIN.  90 tablet  2  . oxyCODONE-acetaminophen (ENDOCET) 10-325 MG per tablet Take 1 tablet by mouth every 6 (six) hours as needed.  60 tablet  0  . predniSONE  (DELTASONE) 20 MG tablet Take 3 tablets (60 mg total) by mouth daily. For 3 days, then 1 tablet daily for 2 days  11 tablet  0  . traMADol (ULTRAM) 50 MG tablet Take 1 tablet (50 mg total) by mouth every 8 (eight) hours as needed for pain.  30 tablet  0  . [DISCONTINUED] ENDOCET 10-325 MG per tablet Take 1 tablet by mouth every 6 (six) hours as needed.       . [DISCONTINUED] predniSONE (DELTASONE) 20 MG tablet Take 3 tablets (60 mg total) by mouth daily. For 3 days, then 1 tablet daily for 2 days  11 tablet  0   No facility-administered encounter medications on file as of 12/19/2012.

## 2012-12-19 NOTE — Assessment & Plan Note (Signed)
Well controlled on current regimen. Renal function stable, no changes today. 

## 2012-12-19 NOTE — Patient Instructions (Addendum)
I am treating your back strain with a 5 day course of prednisone (60 mg daily for 3 days,  Then 20 mg daily for 2 dasy)  Continue flexeril, percocet and tramadol as needed  Physical therapry referral in process   Urology referral to [facility] in process for recurrent stones and thickened bladder wall

## 2012-12-19 NOTE — Telephone Encounter (Signed)
In reviewing patient's chart,  He requested urology referral today ,but we made him a referralto [facility]  in November, and he was notified of an appt on Jan 6 with Dr Orson Slick but did not keep it?  Pleas ask patient why he did not keep appt with [facility].

## 2012-12-19 NOTE — Assessment & Plan Note (Signed)
Resolved with suspension of statin .

## 2012-12-19 NOTE — Assessment & Plan Note (Addendum)
bilateral by recent CT, nonobstructive.  Urology referral requested.  However, referral was made in November to Dr. Orson Slick for BPH,  appt per chart was Jan 6th, with patient made aware at time. But no mention of this today .  Will clarify.

## 2012-12-20 ENCOUNTER — Telehealth: Payer: Self-pay | Admitting: *Deleted

## 2012-12-20 NOTE — Telephone Encounter (Signed)
Called [phone] for Prior Authorization for the cyclobenzaprine 10 mg, form is being faxed over now

## 2012-12-21 NOTE — Telephone Encounter (Signed)
Pt stated he had passed a kidney stone at that time and since it passed he canceled it. Pt was told he has anterior thickening of his bladder.

## 2012-12-21 NOTE — Telephone Encounter (Signed)
Ok,  Referral in process

## 2012-12-26 ENCOUNTER — Other Ambulatory Visit: Payer: Self-pay | Admitting: Internal Medicine

## 2012-12-27 ENCOUNTER — Other Ambulatory Visit: Payer: Self-pay | Admitting: Internal Medicine

## 2012-12-27 NOTE — Telephone Encounter (Signed)
Ok to refill,  Authorized in epic 

## 2012-12-28 ENCOUNTER — Telehealth: Payer: Self-pay | Admitting: *Deleted

## 2012-12-28 NOTE — Telephone Encounter (Signed)
Received a fax from [facility] prior authorization was approved for the Cyclobenzaprine HCL 10 mg tablet ; Through 12.31.2014

## 2012-12-29 ENCOUNTER — Ambulatory Visit: Payer: Self-pay | Admitting: Specialist

## 2012-12-29 LAB — CREATININE, SERUM
EGFR (African American): 56 — ABNORMAL LOW
EGFR (Non-African Amer.): 49 — ABNORMAL LOW

## 2012-12-29 MED ORDER — ALPRAZOLAM 0.5 MG PO TABS: 0.5000 mg | ORAL_TABLET | Freq: Every day | ORAL | Status: DC | PRN

## 2012-12-29 NOTE — Telephone Encounter (Signed)
rx called into pharmacy

## 2012-12-29 NOTE — Addendum Note (Signed)
Addended by: Sueanne Margarita on: 12/29/2012 11:40 AM   Modules accepted: Orders

## 2012-12-30 ENCOUNTER — Telehealth: Payer: Self-pay | Admitting: Internal Medicine

## 2012-12-30 NOTE — Telephone Encounter (Signed)
He was given #60 tablets on Feb 17th  Which is just 10 days,  I cannot refill this early please find out what happened

## 2012-12-30 NOTE — Telephone Encounter (Signed)
°  oxyCODONE-acetaminophen (ENDOCET) 10-325 MG per tablet   Having a lot of pain in his back

## 2012-12-30 NOTE — Telephone Encounter (Signed)
This was just done on 12/19/12. Please advise.

## 2013-01-02 MED ORDER — OXYCODONE-ACETAMINOPHEN 10-325 MG PO TABS: 1.0000 | ORAL_TABLET | Freq: Four times a day (QID) | ORAL | Status: DC | PRN

## 2013-01-02 NOTE — Telephone Encounter (Signed)
Refill given for #120 percocet but he needs to follow up with DR Katrinka Blazing on the MRI .  He can pick up today

## 2013-01-02 NOTE — Telephone Encounter (Signed)
Informed patients husband that she can pick up script today and that he would need to follow up MRI with Dr. Katrinka Blazing

## 2013-01-02 NOTE — Telephone Encounter (Addendum)
Spoke with patient wife, she stated for the last past 3 weeks he has been having horrible pain in his back. He had a MRI on Thursday after visit with Dr. Katrinka Blazing. He is out of his pain medication and he is in really bad pain, can not get out of his chair. Per wife, the pain medication really does not do much for him it just helps him be more comfortable. Dr. Katrinka Blazing did not give him any pain medication, but told them it looks like it is more nerve pain. He will take his last pill today at lunch, he really needs something for his pain.

## 2013-01-02 NOTE — Telephone Encounter (Signed)
Patient wife is calling back for an answer about her husbands medication for pain.

## 2013-01-14 ENCOUNTER — Other Ambulatory Visit: Payer: Self-pay | Admitting: Internal Medicine

## 2013-01-23 ENCOUNTER — Other Ambulatory Visit: Payer: Self-pay | Admitting: Pain Medicine

## 2013-01-23 ENCOUNTER — Ambulatory Visit: Payer: Self-pay | Admitting: Pain Medicine

## 2013-01-23 LAB — SEDIMENTATION RATE: Erythrocyte Sed Rate: 7 mm/hr (ref 0–20)

## 2013-01-28 ENCOUNTER — Other Ambulatory Visit: Payer: Self-pay | Admitting: Internal Medicine

## 2013-03-06 ENCOUNTER — Other Ambulatory Visit: Payer: Self-pay | Admitting: Internal Medicine

## 2013-04-12 ENCOUNTER — Other Ambulatory Visit: Payer: Self-pay | Admitting: Neurology

## 2013-04-12 ENCOUNTER — Other Ambulatory Visit: Payer: Self-pay | Admitting: *Deleted

## 2013-04-12 LAB — LIPID PANEL
Cholesterol: 163 mg/dL (ref 0–200)
HDL Cholesterol: 30 mg/dL — ABNORMAL LOW (ref 40–60)
Ldl Cholesterol, Calc: 112 mg/dL — ABNORMAL HIGH (ref 0–100)

## 2013-04-12 MED ORDER — LOSARTAN POTASSIUM 100 MG PO TABS: 100.0000 mg | ORAL_TABLET | Freq: Every day | ORAL | Status: DC

## 2013-05-09 ENCOUNTER — Other Ambulatory Visit: Payer: Self-pay | Admitting: Internal Medicine

## 2013-05-09 NOTE — Telephone Encounter (Signed)
Refill? Last visit 2/14 

## 2013-05-10 NOTE — Telephone Encounter (Signed)
Rx faxed to pharmacy  

## 2013-05-15 ENCOUNTER — Other Ambulatory Visit: Payer: Self-pay | Admitting: Internal Medicine

## 2013-05-16 ENCOUNTER — Other Ambulatory Visit: Payer: Self-pay | Admitting: Internal Medicine

## 2013-06-09 ENCOUNTER — Telehealth: Payer: Self-pay | Admitting: *Deleted

## 2013-06-09 NOTE — Telephone Encounter (Signed)
Lmom to call our office he needs a f/u appt with Dr. Mariah Milling

## 2013-06-26 NOTE — Telephone Encounter (Signed)
Patient does not want to schedule at this time.  

## 2013-09-07 ENCOUNTER — Other Ambulatory Visit: Payer: Self-pay

## 2013-10-06 ENCOUNTER — Ambulatory Visit (INDEPENDENT_AMBULATORY_CARE_PROVIDER_SITE_OTHER): Payer: Medicare Other | Admitting: Internal Medicine

## 2013-10-06 ENCOUNTER — Encounter: Payer: Self-pay | Admitting: Internal Medicine

## 2013-10-06 VITALS — BP 140/80 | HR 76 | Temp 98.4°F | Resp 12 | Ht 71.0 in | Wt 232.2 lb

## 2013-10-06 DIAGNOSIS — Z125 Encounter for screening for malignant neoplasm of prostate: Secondary | ICD-10-CM

## 2013-10-06 DIAGNOSIS — M549 Dorsalgia, unspecified: Secondary | ICD-10-CM

## 2013-10-06 DIAGNOSIS — E785 Hyperlipidemia, unspecified: Secondary | ICD-10-CM

## 2013-10-06 DIAGNOSIS — Z0000 Encounter for general adult medical examination without abnormal findings: Secondary | ICD-10-CM

## 2013-10-06 DIAGNOSIS — R635 Abnormal weight gain: Secondary | ICD-10-CM

## 2013-10-06 DIAGNOSIS — E119 Type 2 diabetes mellitus without complications: Secondary | ICD-10-CM

## 2013-10-06 DIAGNOSIS — I10 Essential (primary) hypertension: Secondary | ICD-10-CM

## 2013-10-06 DIAGNOSIS — E669 Obesity, unspecified: Secondary | ICD-10-CM

## 2013-10-06 DIAGNOSIS — M5430 Sciatica, unspecified side: Secondary | ICD-10-CM

## 2013-10-06 DIAGNOSIS — Z1211 Encounter for screening for malignant neoplasm of colon: Secondary | ICD-10-CM

## 2013-10-06 MED ORDER — TRAMADOL HCL 50 MG PO TABS: 50.0000 mg | ORAL_TABLET | Freq: Three times a day (TID) | ORAL | Status: DC | PRN

## 2013-10-06 MED ORDER — ZOSTER VACCINE LIVE 19400 UNT/0.65ML ~~LOC~~ SOLR: 0.6500 mL | Freq: Once | SUBCUTANEOUS | Status: DC

## 2013-10-06 MED ORDER — PREDNISONE (PAK) 10 MG PO TABS: ORAL_TABLET | ORAL | Status: DC

## 2013-10-06 NOTE — Progress Notes (Signed)
Patient ID: Austin Rodriguez, male   DOB: [birth year], 67 y.o.   MRN: [id number]  The patient is here for his annual male physical examination and management of other chronic and acute problems including follow up on DM, HTN and hyperlipidemia.  He has been lost to follow up for the last six months .  Was last seen  in January.  Has gained 10 lbs. He has head a recurrence of back pain radiating to right groin for the past  two weeks ,  But wants to avoid narcotics.     The risk factors are reflected in the social history.  The roster of all physicians providing medical care to patient - is listed in the Snapshot section of the chart.  Activities of daily living:  The patient is 100% independent in all ADLs: dressing, toileting, feeding as well as independent mobility  Home safety : The patient has smoke detectors in the home. He wears seatbelts.  There are no firearms at home. There is no violence in the home.   There is no risks for hepatitis, STDs or HIV. There is no   history of blood transfusion. There is no travel history to infectious disease endemic areas of the world.  The patient has seen their dentist in the last six month and  their eye doctor in the last year.  They do not  have excessive sun exposure. They have seen a dermatoloigist in the last year. Discussed the need for sun protection: hats, long sleeves and use of sunscreen if there is significant sun exposure.   Diet: the importance of a healthy diet is discussed. They do have a healthy diet.  The benefits of regular aerobic exercise were discussed. He exercises a minimum of 30 minutes  5 days per week. Depression screen: there are no signs or vegative symptoms of depression- irritability, change in appetite, anhedonia, sadness/tearfullness.  The following portions of the patient's history were reviewed and updated as appropriate: allergies, current medications, past family history, past medical history,  past surgical history,  past social history  and problem list.  Visual acuity was not assessed per patient preference since he has regular follow up with his ophthalmologist. Hearing and body mass index were assessed and reviewed.   During the course of the visit the patient was educated and counseled about appropriate screening and preventive services including :  nutrition counseling, colorectal cancer screening, and recommended immunizations.    Objective:   BP 140/80  Pulse 76  Temp(Src) 98.4 F (36.9 C) (Oral)  Resp 12  Ht 5\' 11"  (1.803 m)  Wt 232 lb 4 oz (105.348 kg)  BMI 32.41 kg/m2  SpO2 98%  General Appearance:    Alert, cooperative, no distress, appears stated age  Head:    Normocephalic, without obvious abnormality, atraumatic  Eyes:    PERRL, conjunctiva/corneas clear, EOM's intact, fundi    benign, both eyes       Ears:    Normal TM's and external ear canals, both ears  Nose:   Nares normal, septum midline, mucosa normal, no drainage   or sinus tenderness  Throat:   Lips, mucosa, and tongue normal; teeth and gums normal  Neck:   Supple, symmetrical, trachea midline, no adenopathy;       thyroid:  No enlargement/tenderness/nodules; no carotid   bruit or JVD  Back:     Symmetric, no curvature, ROM normal, no CVA tenderness  Lungs:     Clear to auscultation bilaterally, respirations  unlabored  Chest wall:    No tenderness or deformity  Heart:    Regular rate and rhythm, S1 and S2 normal, no murmur, rub   or gallop  Abdomen:     Soft, non-tender, bowel sounds active all four quadrants,    no masses, no organomegaly     Extremities:   Extremities normal, atraumatic, no cyanosis or edema  Pulses:   2+ and symmetric all extremities  Skin:   Skin color, texture, turgor normal, no rashes or lesions  Lymph nodes:   Cervical, supraclavicular, and axillary nodes normal  Neurologic:   CNII-XII intact. Normal strength, sensation and reflexes      throughout   Assessment and Plan:  Screening for  prostate cancer Annual DRE and PSAs are now done by Dr . Patsi Sears.   Screening for colon cancer He is due for 10 yr colonoscopy in 2015.   Obesity (BMI 30.0-34.9) I have addressed  BMI and recommended a low glycemic index diet utilizing smaller more frequent meals to increase metabolism.  I have also recommended that patient start exercising with a goal of 30 minutes of aerobic exercise a minimum of 5 days per week.    Encounter for preventive health examination Annual male exam was done iexcluding testicular and prostate exam .  Colon ca screening was reviewed and options given.    Lumbago with sciatica Prednisone taper, over 6 days,  And tramadol for back pain flare.   Diabetes mellitus type 2, diet-controlled He is overdue to fasting lipoids and a1c.   Reminded to get diabetic eye exam ASAP.  [facility].  fott exam normal today except for dystrophic right great toe nail secondary to remote trauma.      Updated Medication List Outpatient Encounter Prescriptions as of 10/06/2013  Medication Sig  . ALPRAZolam (XANAX) 0.5 MG tablet TAKE 1 TABLET EVERY DAY AS NEEDED FOR ANXIETY /SLEEP  . aspirin 81 MG tablet Take 81 mg by mouth daily.    . cholecalciferol (VITAMIN D) 1000 UNITS tablet Take 1,000 Units by mouth daily.    . citalopram (CELEXA) 20 MG tablet TAKE 1 TABLET BY MOUTH DAILY  . fish oil-omega-3 fatty acids 1000 MG capsule Take 2 g by mouth daily.    Marland Kitchen gabapentin (NEURONTIN) 300 MG capsule Take 2 capsules (600 mg total) by mouth 3 (three) times daily.  . Lancets MISC Patient test blood sugar two times a day.  . losartan (COZAAR) 100 MG tablet Take 1 tablet (100 mg total) by mouth daily.  . Multiple Vitamin (MULTIVITAMIN) tablet Take 1 tablet by mouth daily.    . QUEtiapine (SEROQUEL) 300 MG tablet Take 1 tablet by mouth at bedtime.  . tamsulosin (FLOMAX) 0.4 MG CAPS TAKE 1 CAPSULE EVERY DAY  . predniSONE (STERAPRED UNI-PAK) 10 MG tablet 6 tablets in the morning on Day 1 ,  then reduce by 1 tablet daily until gone  . traMADol (ULTRAM) 50 MG tablet Take 1 tablet (50 mg total) by mouth every 8 (eight) hours as needed for moderate pain.  Marland Kitchen zoster vaccine live, PF, (ZOSTAVAX) 11914 UNT/0.65ML injection Inject 19,400 Units into the skin once.  . zoster vaccine live, PF, (ZOSTAVAX) [id number] UNT/0.65ML injection Inject 19,400 Units into the skin once.  . [DISCONTINUED] cyclobenzaprine (FLEXERIL) 10 MG tablet Take 1 tablet (10 mg total) by mouth 3 (three) times daily as needed.  . [DISCONTINUED] Imiquimod 3.75 % CREA Apply daily in the evening to affected areas after soaking hands in warm water  for 15 minutes  . [DISCONTINUED] nabumetone (RELAFEN) 500 MG tablet TAKE 1 TABLET TWICE A DAY  . [DISCONTINUED] oxyCODONE-acetaminophen (ENDOCET) 10-325 MG per tablet Take 1 tablet by mouth every 6 (six) hours as needed.  . [DISCONTINUED] predniSONE (DELTASONE) 20 MG tablet Take 3 tablets (60 mg total) by mouth daily. For 3 days, then 1 tablet daily for 2 days  . [DISCONTINUED] scopolamine (TRANSDERM-SCOP) 1.5 MG Place 1 patch (1.5 mg total) onto the skin every 3 (three) days.  . [DISCONTINUED] tamsulosin (FLOMAX) 0.4 MG CAPS TAKE 1 CAPSULE EVERY DAY  . [DISCONTINUED] traMADol (ULTRAM) 50 MG tablet Take 1 tablet (50 mg total) by mouth every 8 (eight) hours as needed for pain.  . [DISCONTINUED] traMADol (ULTRAM) 50 MG tablet TAKE 1 TABLET BY MOUTH EVERY 6 HOURS AS NEEDED FOR PAIN.

## 2013-10-06 NOTE — Progress Notes (Signed)
Pre-visit discussion using our clinic review tool. No additional management support is needed unless otherwise documented below in the visit note.  

## 2013-10-06 NOTE — Patient Instructions (Signed)
You had your annual Medicare wellness exam today  I advise you to have a  [facility] using the prescription I have given you  because Medicare will not reimburse for them.   Please return for fasting labs at your earliest convenience.   We will contact you with the bloodwork results  You can talk to Dr. Omelia Blackwater about the concentration/ADD symptoms you may be having and whether you need Adderall  I am prescribing a 6 day prednisone taper for your back pain.  You may add tylenol and tramadol but NOT advil to this medication  If your back pain does not improve in two weeks,  Let me know

## 2013-10-08 ENCOUNTER — Encounter: Payer: Self-pay | Admitting: Internal Medicine

## 2013-10-08 NOTE — Assessment & Plan Note (Signed)
Annual DRE and PSAs are now done by Dr . Patsi Sears.

## 2013-10-08 NOTE — Assessment & Plan Note (Signed)
Annual male exam was done iexcluding testicular and prostate exam .  Colon ca screening was reviewed and options given.

## 2013-10-08 NOTE — Assessment & Plan Note (Addendum)
He is due for 10 yr colonoscopy in 2015.

## 2013-10-08 NOTE — Assessment & Plan Note (Signed)
Prednisone taper, over 6 days,  And tramadol for back pain flare.

## 2013-10-08 NOTE — Assessment & Plan Note (Addendum)
I have addressed  BMI and recommended a low glycemic index diet utilizing smaller more frequent meals to increase metabolism.  I have also recommended that patient start exercising with a goal of 30 minutes of aerobic exercise a minimum of 5 days per week.  

## 2013-10-08 NOTE — Assessment & Plan Note (Signed)
He is overdue to fasting lipoids and a1c.   Reminded to get diabetic eye exam ASAP.  [facility].  fott exam normal today except for dystrophic right great toe nail secondary to remote trauma.

## 2013-10-12 ENCOUNTER — Other Ambulatory Visit (INDEPENDENT_AMBULATORY_CARE_PROVIDER_SITE_OTHER): Payer: Medicare Other

## 2013-10-12 DIAGNOSIS — E119 Type 2 diabetes mellitus without complications: Secondary | ICD-10-CM

## 2013-10-12 DIAGNOSIS — E785 Hyperlipidemia, unspecified: Secondary | ICD-10-CM

## 2013-10-12 DIAGNOSIS — R635 Abnormal weight gain: Secondary | ICD-10-CM

## 2013-10-12 LAB — LDL CHOLESTEROL, DIRECT: Direct LDL: 158.2 mg/dL

## 2013-10-12 LAB — TSH: TSH: 1.03 u[IU]/mL (ref 0.35–5.50)

## 2013-10-12 LAB — COMPREHENSIVE METABOLIC PANEL
ALT: 29 U/L (ref 0–53)
AST: 23 U/L (ref 0–37)
Alkaline Phosphatase: 93 U/L (ref 39–117)
Calcium: 9.1 mg/dL (ref 8.4–10.5)
Chloride: 107 mEq/L (ref 96–112)
Creatinine, Ser: 1.5 mg/dL (ref 0.4–1.5)
Potassium: 4.7 mEq/L (ref 3.5–5.1)
Sodium: 140 mEq/L (ref 135–145)

## 2013-10-12 LAB — LIPID PANEL
Cholesterol: 202 mg/dL — ABNORMAL HIGH (ref 0–200)
HDL: 40.5 mg/dL (ref >39.00)
VLDL: 18.4 mg/dL (ref 0.0–40.0)

## 2013-10-12 LAB — HEMOGLOBIN A1C: Hgb A1c MFr Bld: 6 % (ref 4.6–6.5)

## 2013-10-12 LAB — MICROALBUMIN / CREATININE URINE RATIO: Creatinine,U: 130.1 mg/dL

## 2013-10-13 ENCOUNTER — Encounter: Payer: Self-pay | Admitting: Internal Medicine

## 2013-10-16 ENCOUNTER — Other Ambulatory Visit: Payer: Self-pay | Admitting: Internal Medicine

## 2013-10-16 NOTE — Telephone Encounter (Signed)
Mailed unread message to pt, requested call back to office to discuss 

## 2013-10-18 ENCOUNTER — Other Ambulatory Visit: Payer: Self-pay | Admitting: *Deleted

## 2013-10-18 MED ORDER — LOSARTAN POTASSIUM 100 MG PO TABS: 100.0000 mg | ORAL_TABLET | Freq: Every day | ORAL | Status: DC

## 2013-10-20 ENCOUNTER — Telehealth: Payer: Self-pay | Admitting: *Deleted

## 2013-10-20 DIAGNOSIS — E785 Hyperlipidemia, unspecified: Secondary | ICD-10-CM

## 2013-10-20 DIAGNOSIS — Z79899 Other long term (current) drug therapy: Secondary | ICD-10-CM

## 2013-10-20 MED ORDER — ATORVASTATIN CALCIUM 20 MG PO TABS: ORAL_TABLET | ORAL | Status: DC

## 2013-10-20 NOTE — Telephone Encounter (Signed)
Pt received lab results and is willing to start on a statin. Uses [facility] [address]. Advised he would need to return for labs after starting the medication, but that I would call him back with exactly when Dr. Darrick Huntsman would want him to return.

## 2013-10-20 NOTE — Telephone Encounter (Signed)
I am recommending generic lipitor ,  Every other day dosing given his prior intolerance to zocor. Return for fasting labs in mid or late January  rx sent to [facility]

## 2013-10-20 NOTE — Telephone Encounter (Signed)
Left message, notifying pt and requesting call back to schedule fasting labs.

## 2013-10-24 ENCOUNTER — Telehealth: Payer: Self-pay | Admitting: Internal Medicine

## 2013-10-24 MED ORDER — DESONIDE 0.05 % EX LOTN: TOPICAL_LOTION | Freq: Two times a day (BID) | CUTANEOUS | Status: DC

## 2013-10-24 NOTE — Telephone Encounter (Signed)
Pt states his dermatologist used to prescribe, but he is no longer in network. States Dr. Darrick Huntsman was aware of the rash at his last physical. Ok to send Rx?

## 2013-10-24 NOTE — Telephone Encounter (Signed)
Ok to refill,  Refill sent  

## 2013-10-24 NOTE — Telephone Encounter (Signed)
Left message, notifying pt that Rx was sent

## 2013-10-24 NOTE — Telephone Encounter (Signed)
The patient is requesting a prescription for Desonide lotion 0.05% for the rash on his nose.

## 2013-11-16 LAB — HM DIABETES EYE EXAM

## 2013-11-27 ENCOUNTER — Other Ambulatory Visit (INDEPENDENT_AMBULATORY_CARE_PROVIDER_SITE_OTHER): Payer: Medicare Other

## 2013-11-27 DIAGNOSIS — Z79899 Other long term (current) drug therapy: Secondary | ICD-10-CM

## 2013-11-27 LAB — COMPREHENSIVE METABOLIC PANEL
ALT: 21 U/L (ref 0–53)
AST: 18 U/L (ref 0–37)
Albumin: 3.5 g/dL (ref 3.5–5.2)
Alkaline Phosphatase: 104 U/L (ref 39–117)
BUN: 21 mg/dL (ref 6–23)
CALCIUM: 8.5 mg/dL (ref 8.4–10.5)
CHLORIDE: 110 meq/L (ref 96–112)
CO2: 25 mEq/L (ref 19–32)
CREATININE: 1.4 mg/dL (ref 0.4–1.5)
GFR: 52.02 mL/min — ABNORMAL LOW (ref >60.00)
Glucose, Bld: 141 mg/dL — ABNORMAL HIGH (ref 70–99)
POTASSIUM: 4.3 meq/L (ref 3.5–5.1)
Sodium: 141 mEq/L (ref 135–145)
Total Bilirubin: 0.6 mg/dL (ref 0.3–1.2)
Total Protein: 6.6 g/dL (ref 6.0–8.3)

## 2013-11-27 LAB — CK: CK TOTAL: 78 U/L (ref 7–232)

## 2013-11-30 ENCOUNTER — Encounter: Payer: Self-pay | Admitting: Internal Medicine

## 2013-12-04 ENCOUNTER — Other Ambulatory Visit: Payer: Self-pay | Admitting: Internal Medicine

## 2013-12-04 NOTE — Telephone Encounter (Signed)
Mailed unread message to pt  

## 2013-12-04 NOTE — Telephone Encounter (Signed)
Last visit 10/06/13, ok refill?

## 2013-12-06 ENCOUNTER — Other Ambulatory Visit: Payer: Self-pay | Admitting: Internal Medicine

## 2013-12-06 NOTE — Telephone Encounter (Signed)
Ok to refill,  Authorized in epic. please call in  

## 2013-12-07 NOTE — Telephone Encounter (Signed)
Rx called to pharmacy

## 2013-12-13 ENCOUNTER — Other Ambulatory Visit: Payer: Self-pay | Admitting: Internal Medicine

## 2014-02-13 ENCOUNTER — Other Ambulatory Visit: Payer: Self-pay | Admitting: Internal Medicine

## 2014-02-13 NOTE — Telephone Encounter (Signed)
Electronic Rx request, please advise. 

## 2014-02-19 ENCOUNTER — Ambulatory Visit (INDEPENDENT_AMBULATORY_CARE_PROVIDER_SITE_OTHER): Payer: Medicare Other | Admitting: Internal Medicine

## 2014-02-19 ENCOUNTER — Encounter: Payer: Self-pay | Admitting: *Deleted

## 2014-02-19 ENCOUNTER — Encounter: Payer: Self-pay | Admitting: Internal Medicine

## 2014-02-19 VITALS — BP 132/66 | HR 76 | Temp 98.5°F | Resp 18 | Ht 71.25 in | Wt 237.5 lb

## 2014-02-19 DIAGNOSIS — F411 Generalized anxiety disorder: Secondary | ICD-10-CM

## 2014-02-19 DIAGNOSIS — B372 Candidiasis of skin and nail: Secondary | ICD-10-CM

## 2014-02-19 DIAGNOSIS — F5105 Insomnia due to other mental disorder: Secondary | ICD-10-CM | POA: Insufficient documentation

## 2014-02-19 DIAGNOSIS — Z1211 Encounter for screening for malignant neoplasm of colon: Secondary | ICD-10-CM

## 2014-02-19 DIAGNOSIS — E119 Type 2 diabetes mellitus without complications: Secondary | ICD-10-CM

## 2014-02-19 DIAGNOSIS — I10 Essential (primary) hypertension: Secondary | ICD-10-CM

## 2014-02-19 DIAGNOSIS — L22 Diaper dermatitis: Secondary | ICD-10-CM

## 2014-02-19 DIAGNOSIS — Z23 Encounter for immunization: Secondary | ICD-10-CM

## 2014-02-19 DIAGNOSIS — F419 Anxiety disorder, unspecified: Secondary | ICD-10-CM

## 2014-02-19 DIAGNOSIS — Z0000 Encounter for general adult medical examination without abnormal findings: Secondary | ICD-10-CM

## 2014-02-19 DIAGNOSIS — Z125 Encounter for screening for malignant neoplasm of prostate: Secondary | ICD-10-CM

## 2014-02-19 DIAGNOSIS — F489 Nonpsychotic mental disorder, unspecified: Secondary | ICD-10-CM

## 2014-02-19 MED ORDER — BUSPIRONE HCL 7.5 MG PO TABS: 7.5000 mg | ORAL_TABLET | Freq: Three times a day (TID) | ORAL | Status: DC

## 2014-02-19 MED ORDER — CITALOPRAM HYDROBROMIDE 20 MG PO TABS: ORAL_TABLET | ORAL | Status: DC

## 2014-02-19 MED ORDER — NYSTATIN 100000 UNIT/GM EX POWD: Freq: Two times a day (BID) | CUTANEOUS | Status: DC

## 2014-02-19 NOTE — Patient Instructions (Addendum)
I am Starting you on  buspirone at 7.5 mg twice daily to help contorl your anxiety.  You can increase to 3 daily if needed for anxiety  Or for insomnia  Continue the Seroquel and citalopram for now  Return for fasting labs ASAP  You received  the Prevnar vaccine today (Pneumonia)  Referrals to Dr Maryruth BunKapur (psychiatry), and Dr Loreta AveMann (gastroenterology) are in process

## 2014-02-19 NOTE — Progress Notes (Signed)
Pre-visit discussion using our clinic review tool. No additional management support is needed unless otherwise documented below in the visit note.  

## 2014-02-19 NOTE — Assessment & Plan Note (Signed)
Done by Patsi Searsannenbaum annually with DRE and PSA

## 2014-02-19 NOTE — Assessment & Plan Note (Addendum)
Foot exam normal. Eye exam due.  Needs A1c .

## 2014-02-19 NOTE — Progress Notes (Signed)
Patient ID: Austin BattiestRobert V Voth, male   DOB: 1946/02/21, 68 y.o.   MRN: [id number] The patient is here for annual Medicare wellness examination and management of other chronic and acute problems including anxeity,  Inguinal itching,  Weight gain and insomnia.    The risk factors are reflected in the social history.  The roster of all physicians providing medical care to patient - is listed in the Snapshot section of the chart.  Activities of daily living:  The patient is 100% independent in all ADLs: dressing, toileting, feeding as well as independent mobility  Home safety : The patient has smoke detectors in the home. They wear seatbelts.  There are no firearms at home. There is no violence in the home.   There is no risks for hepatitis, STDs or HIV. There is no   history of blood transfusion. They have no travel history to infectious disease endemic areas of the world.  The patient has seen their dentist in the last six month. They have seen their eye doctor in the last year. They admit to slight hearing difficulty with regard to whispered voices and some television programs.  They have deferred audiologic testing in the last year.  They do not  have excessive sun exposure. Discussed the need for sun protection: hats, long sleeves and use of sunscreen if there is significant sun exposure.   Diet: the importance of a healthy diet is discussed. They do have a healthy diet.  The benefits of regular aerobic exercise were discussed. She walks 4 times per week ,  20 minutes.   Depression screen: there are no signs or vegative symptoms of depression- irritability, change in appetite, anhedonia, sadness/tearfullness.  Cognitive assessment: the patient manages all their financial and personal affairs and is actively engaged. They could relate day,date,year and events; recalled 2/3 objects at 3 minutes; performed clock-face test normally.  The following portions of the patient's history were reviewed and  updated as appropriate: allergies, current medications, past family history, past medical history,  past surgical history, past social history  and problem list.  Visual acuity was not assessed per patient preference since she has regular follow up with her ophthalmologist. Hearing and body mass index were assessed and reviewed.   During the course of the visit the patient was educated and counseled about appropriate screening and preventive services including : fall prevention , diabetes screening, nutrition counseling, colorectal cancer screening, and recommended immunizations.    Objective:   BP 132/66  Pulse 76  Temp(Src) 98.5 F (36.9 C) (Oral)  Resp 18  Ht 5' 11.25" (1.81 m)  Wt 237 lb 8 oz (107.729 kg)  BMI 32.88 kg/m2  SpO2 96%  General Appearance:    Alert, cooperative, no distress, appears stated age  Head:    Normocephalic, without obvious abnormality, atraumatic  Eyes:    PERRL, conjunctiva/corneas clear, EOM's intact, fundi    benign, both eyes       Ears:    Normal TM's and external ear canals, both ears  Nose:   Nares normal, septum midline, mucosa normal, no drainage   or sinus tenderness  Throat:   Lips, mucosa, and tongue normal; teeth and gums normal  Neck:   Supple, symmetrical, trachea midline, no adenopathy;       thyroid:  No enlargement/tenderness/nodules; no carotid   bruit or JVD  Back:     Symmetric, no curvature, ROM normal, no CVA tenderness  Lungs:     Clear to auscultation bilaterally,  respirations unlabored  Chest wall:    No tenderness or deformity  Heart:    Regular rate and rhythm, S1 and S2 normal, no murmur, rub   or gallop  Abdomen:     Soft, non-tender, bowel sounds active all four quadrants,    no masses, no organomegaly  Genitalia:    Normal male without lesion, discharge or tenderness  Rectal:    Deferred to Urology per patient preference      Extremities:   Extremities normal, atraumatic, no cyanosis or edema  Pulses:   2+ and  symmetric all extremities  Skin:   Mild erythema in inguinal area, under scrotum.     Lymph nodes:   Cervical, supraclavicular, and axillary nodes normal  Neurologic:   CNII-XII intact. Normal strength, sensation and reflexes      throughout   Assessment and plan:  Screening for prostate cancer Done by Patsi Sears annually with DRE and PSA   Diabetes mellitus type 2, diet-controlled Foot exam normal. Eye exam due.  Needs A1c .    Hypertension Well controlled on current regimen. Renal function stable, no changes today.  Lab Results  Component Value Date   CREATININE 1.4 11/27/2013     Diaper candidiasis Nystatin powder, use bid until resolved  Insomnia due to mental condition Using seroquel 300 mg at bedtime for anxiety  Prescribed initially  by Dr  Noel Gerold and continued by Dr Omelia Blackwater . The two psychiatrist disagreed on  His diagnosis of bipolar disorder.  He is requesting chang in medicaton  due to weight gain but cnanot sleep without it and has no prior trial of ambien, lunesta,  Or silenor. Will review psych records before considering change.   Anxiety Managed with celexa and low dose alprazolam. Consider trial of buspirone.   Encounter for preventive health examination Annual male exam was done including testicular without prostate exam.  Colon ca screening was reviewed and options given.     Updated Medication List Outpatient Encounter Prescriptions as of 02/19/2014  Medication Sig  . ALPRAZolam (XANAX) 0.5 MG tablet TAKE 1 TABLET BY MOUTH DAILY FOR ANXIETY/SLEEP  . aspirin 81 MG tablet Take 81 mg by mouth daily.    Marland Kitchen atorvastatin (LIPITOR) 20 MG tablet One tablet by mouth every other day  . cholecalciferol (VITAMIN D) 1000 UNITS tablet Take 1,000 Units by mouth daily.    . citalopram (CELEXA) 20 MG tablet TAKE 1 TABLET BY MOUTH DAILY  . desonide (DESOWEN) 0.05 % lotion Apply topically 2 (two) times daily.  . fish oil-omega-3 fatty acids 1000 MG capsule Take 2 g by mouth  daily.    Marland Kitchen gabapentin (NEURONTIN) 300 MG capsule TAKE 2 CAPSULES BY MOUTH 3 TIMES DAILY.  Marland Kitchen Lancets MISC Patient test blood sugar two times a day.  . losartan (COZAAR) 100 MG tablet Take 1 tablet (100 mg total) by mouth daily.  . Multiple Vitamin (MULTIVITAMIN) tablet Take 1 tablet by mouth daily.    . QUEtiapine (SEROQUEL) 300 MG tablet Take 1 tablet by mouth at bedtime.  . tamsulosin (FLOMAX) 0.4 MG CAPS TAKE 1 CAPSULE EVERY DAY  . traMADol (ULTRAM) 50 MG tablet TAKE 1 TABLET EVERY 8 HOURS AS NEEDED FOR MODERATE PAIN  . [DISCONTINUED] citalopram (CELEXA) 20 MG tablet TAKE 1 TABLET BY MOUTH DAILY  . busPIRone (BUSPAR) 7.5 MG tablet Take 1 tablet (7.5 mg total) by mouth 3 (three) times daily.  Marland Kitchen nystatin (MYCOSTATIN) powder Apply topically 2 (two) times daily.  . predniSONE (STERAPRED UNI-PAK)  10 MG tablet 6 tablets in the morning on Day 1 , then reduce by 1 tablet daily until gone  . zoster vaccine live, PF, (ZOSTAVAX) [id number] UNT/0.65ML injection Inject 19,400 Units into the skin once.  . zoster vaccine live, PF, (ZOSTAVAX) [id number] UNT/0.65ML injection Inject 19,400 Units into the skin once.

## 2014-02-20 ENCOUNTER — Telehealth: Payer: Self-pay | Admitting: *Deleted

## 2014-02-20 MED ORDER — QUETIAPINE FUMARATE 300 MG PO TABS: 300.0000 mg | ORAL_TABLET | Freq: Every day | ORAL | Status: DC

## 2014-02-20 NOTE — Assessment & Plan Note (Signed)
Well controlled on current regimen. Renal function stable, no changes today.  Lab Results  Component Value Date   CREATININE 1.4 11/27/2013

## 2014-02-20 NOTE — Assessment & Plan Note (Signed)
Annual male exam was done including testicular without prostate exam.  Colon ca screening was reviewed and options given.

## 2014-02-20 NOTE — Telephone Encounter (Signed)
Ok to refill,  Refill sent  

## 2014-02-20 NOTE — Assessment & Plan Note (Signed)
Managed with celexa and low dose alprazolam. Consider trial of buspirone.

## 2014-02-20 NOTE — Telephone Encounter (Signed)
OK to  Fill patient Seroquel?

## 2014-02-20 NOTE — Assessment & Plan Note (Signed)
Nystatin powder, use bid until resolved

## 2014-02-20 NOTE — Assessment & Plan Note (Signed)
Using seroquel 300 mg at bedtime for anxiety  Prescribed initially  by Dr  Noel Geroldohen and continued by Dr Omelia BlackwaterHeaden . The two psychiatrist disagreed on  His diagnosis of bipolar disorder.  He is requesting chang in medicaton  due to weight gain but cnanot sleep without it and has no prior trial of ambien, lunesta,  Or silenor. Will review psych records before considering change.

## 2014-02-22 ENCOUNTER — Telehealth: Payer: Self-pay | Admitting: *Deleted

## 2014-02-22 DIAGNOSIS — E119 Type 2 diabetes mellitus without complications: Secondary | ICD-10-CM

## 2014-02-22 DIAGNOSIS — E785 Hyperlipidemia, unspecified: Secondary | ICD-10-CM

## 2014-02-22 NOTE — Telephone Encounter (Signed)
Pt is coming in tomorrow what labs and dx?  

## 2014-02-23 ENCOUNTER — Other Ambulatory Visit (INDEPENDENT_AMBULATORY_CARE_PROVIDER_SITE_OTHER): Payer: Medicare Other

## 2014-02-23 DIAGNOSIS — E119 Type 2 diabetes mellitus without complications: Secondary | ICD-10-CM

## 2014-02-23 DIAGNOSIS — E785 Hyperlipidemia, unspecified: Secondary | ICD-10-CM

## 2014-02-23 LAB — LIPID PANEL
CHOLESTEROL: 127 mg/dL (ref 0–200)
HDL: 29.7 mg/dL — ABNORMAL LOW (ref >39.00)
LDL Cholesterol: 81 mg/dL (ref 0–99)
Total CHOL/HDL Ratio: 4
Triglycerides: 82 mg/dL (ref 0.0–149.0)
VLDL: 16.4 mg/dL (ref 0.0–40.0)

## 2014-02-23 LAB — COMPREHENSIVE METABOLIC PANEL
ALT: 35 U/L (ref 0–53)
AST: 34 U/L (ref 0–37)
Albumin: 4.1 g/dL (ref 3.5–5.2)
Alkaline Phosphatase: 96 U/L (ref 39–117)
BILIRUBIN TOTAL: 0.9 mg/dL (ref 0.3–1.2)
BUN: 22 mg/dL (ref 6–23)
CO2: 24 mEq/L (ref 19–32)
Calcium: 9.4 mg/dL (ref 8.4–10.5)
Chloride: 109 mEq/L (ref 96–112)
Creatinine, Ser: 1.3 mg/dL (ref 0.4–1.5)
GFR: 56.97 mL/min — ABNORMAL LOW (ref >60.00)
GLUCOSE: 119 mg/dL — AB (ref 70–99)
Potassium: 4.6 mEq/L (ref 3.5–5.1)
SODIUM: 140 meq/L (ref 135–145)
Total Protein: 7.4 g/dL (ref 6.0–8.3)

## 2014-02-23 LAB — MICROALBUMIN / CREATININE URINE RATIO
Creatinine,U: 145.7 mg/dL
Microalb Creat Ratio: 35.4 mg/g — ABNORMAL HIGH (ref 0.0–30.0)
Microalb, Ur: 51.5 mg/dL — ABNORMAL HIGH (ref 0.0–1.9)

## 2014-02-23 LAB — HEMOGLOBIN A1C: Hgb A1c MFr Bld: 6.1 % (ref 4.6–6.5)

## 2014-02-25 ENCOUNTER — Encounter: Payer: Self-pay | Admitting: Internal Medicine

## 2014-02-27 NOTE — Telephone Encounter (Signed)
Mailed unread message to pt  

## 2014-03-02 ENCOUNTER — Telehealth: Payer: Self-pay

## 2014-03-02 NOTE — Telephone Encounter (Signed)
Relevant patient education assigned to patient using Emmi. ° °

## 2014-03-12 ENCOUNTER — Other Ambulatory Visit: Payer: Self-pay | Admitting: Internal Medicine

## 2014-03-20 ENCOUNTER — Other Ambulatory Visit: Payer: Self-pay | Admitting: Internal Medicine

## 2014-03-20 NOTE — Telephone Encounter (Signed)
Ok to refill,  printed rx  

## 2014-03-20 NOTE — Telephone Encounter (Signed)
Script faxed.

## 2014-03-20 NOTE — Telephone Encounter (Signed)
Last visit 02/19/14, ok refill? 

## 2014-04-04 ENCOUNTER — Encounter: Payer: Self-pay | Admitting: Internal Medicine

## 2014-04-13 ENCOUNTER — Other Ambulatory Visit: Payer: Self-pay | Admitting: Internal Medicine

## 2014-04-16 ENCOUNTER — Other Ambulatory Visit: Payer: Self-pay | Admitting: Internal Medicine

## 2014-05-07 ENCOUNTER — Other Ambulatory Visit: Payer: Self-pay | Admitting: Internal Medicine

## 2014-05-07 NOTE — Telephone Encounter (Signed)
Last refill 6.15.15, last OV 4.20.15, future OV 7.20.15.  Please advise refill.

## 2014-05-21 ENCOUNTER — Ambulatory Visit: Payer: Medicare Other | Admitting: Internal Medicine

## 2014-06-25 ENCOUNTER — Encounter: Payer: Self-pay | Admitting: Internal Medicine

## 2014-06-25 ENCOUNTER — Ambulatory Visit (INDEPENDENT_AMBULATORY_CARE_PROVIDER_SITE_OTHER): Payer: Medicare Other | Admitting: Internal Medicine

## 2014-06-25 VITALS — BP 122/68 | HR 101 | Temp 97.8°F | Resp 16 | Ht 71.25 in | Wt 227.2 lb

## 2014-06-25 DIAGNOSIS — R7989 Other specified abnormal findings of blood chemistry: Secondary | ICD-10-CM

## 2014-06-25 DIAGNOSIS — M5440 Lumbago with sciatica, unspecified side: Secondary | ICD-10-CM

## 2014-06-25 DIAGNOSIS — L219 Seborrheic dermatitis, unspecified: Secondary | ICD-10-CM | POA: Insufficient documentation

## 2014-06-25 DIAGNOSIS — F411 Generalized anxiety disorder: Secondary | ICD-10-CM

## 2014-06-25 DIAGNOSIS — E669 Obesity, unspecified: Secondary | ICD-10-CM

## 2014-06-25 DIAGNOSIS — F419 Anxiety disorder, unspecified: Secondary | ICD-10-CM

## 2014-06-25 DIAGNOSIS — Z1211 Encounter for screening for malignant neoplasm of colon: Secondary | ICD-10-CM

## 2014-06-25 DIAGNOSIS — M5430 Sciatica, unspecified side: Secondary | ICD-10-CM

## 2014-06-25 DIAGNOSIS — E119 Type 2 diabetes mellitus without complications: Secondary | ICD-10-CM

## 2014-06-25 LAB — HM COLONOSCOPY: HM Colonoscopy: ABNORMAL

## 2014-06-25 LAB — HM DIABETES FOOT EXAM: HM DIABETIC FOOT EXAM: NORMAL

## 2014-06-25 MED ORDER — TRAMADOL HCL 50 MG PO TABS: ORAL_TABLET | ORAL | Status: DC

## 2014-06-25 MED ORDER — TAMSULOSIN HCL 0.4 MG PO CAPS: ORAL_CAPSULE | ORAL | Status: DC

## 2014-06-25 MED ORDER — TRIAMCINOLONE ACETONIDE 0.1 % EX CREA: 1.0000 "application " | TOPICAL_CREAM | Freq: Two times a day (BID) | CUTANEOUS | Status: DC

## 2014-06-25 MED ORDER — LOSARTAN POTASSIUM 100 MG PO TABS: ORAL_TABLET | ORAL | Status: DC

## 2014-06-25 NOTE — Progress Notes (Signed)
Pre-visit discussion using our clinic review tool. No additional management support is needed unless otherwise documented below in the visit note.  

## 2014-06-25 NOTE — Assessment & Plan Note (Signed)
I have congratulated hr in reduction of   BMI and encouraged  Continued weight loss with goal weight of 214 lbs  To get BMI < 30 using a low glycemic index diet and regular exercise a minimum of 5 days per week.

## 2014-06-25 NOTE — Patient Instructions (Addendum)
[employer] Bars are loaded with fiber    Try the   Chocolate Brownie  15 sec in microwave,.   17 gm fiber 1 g sugar   [employer] less g of sugar than atkins bars     Your  First goal is 214 lbs (BMI < 30)   Triamcinolone ointment for your facial rash  Use twice daily until resolved.   Save your face!!!!  Wear a sombrero when you are working  outside !!!

## 2014-06-25 NOTE — Progress Notes (Signed)
Patient ID: Austin Rodriguez, male   DOB: [birth year], 68 y.o.   MRN: [id number]   Patient Active Problem List   Diagnosis Date Noted  . Seborrheic dermatitis 06/25/2014  . 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 Obesity (BMI 30.0-34.9) 12/19/2012  . Diffuse pain 11/07/2012  . BPH (benign prostatic hypertrophy)   . Encounter for preventive health examination 05/12/2012  . Atypical chest pain 05/12/2012  . Other abnormal blood chemistry 04/24/2012  . Type II or unspecified type diabetes mellitus with renal manifestations, not stated as uncontrolled 04/22/2012  . Hyperlipidemia   . Hypertension   . Depression   . Anxiety   . Hyperplastic colon polyp 08/31/2011  . Lumbago with sciatica 08/31/2011  . Screening for prostate cancer 08/31/2011    Subjective:  CC:   Chief Complaint  Patient presents with  . Follow-up  . Diabetes    HPI:   Austin Rodriguez is a 68 y.o. male who presents for  Patient  arrived 15 min late for 15 min appt but was seen .   Follow up on multiple issues including  Type 2 DM, hyperlipemia, chronic back pain , hypertension and obesity .  Last seen A[pril 21 for wellness exam , at which time his DN was well controlled .   DM  Lab Results  Component Value Date   HGBA1C 6.1 02/23/2014     He appears well today and states that he has been physically active in the yard and doing volunteer work with no recent flares of back or neck pain .   He has lost weight by following a low glycemic index diet and exercising.   He had a Colonoscopy last Friday, and a very large polyp was removed in sections.  He has to have a repeat colonoscopy up in Feb due to size of polyp, per Dr Loreta Ave  Anxiety :  His anxiety has been a problem.  Use of Xanax and addition of buspirone discussed   Facial rash; he appears to have seborrheic dermatitis in the nasolabial folds and is requesting an alternative to desonide cream or some other cream that was prescribed prior to that,  he is not sure which one worked and which one was too $$$   He is requesting refill on tramadol ,, losartan and tamsulosin.    Past Medical History  Diagnosis Date  . cervical spondylosis   . Diabetes mellitus   . Chronic neck pain   . Hyperlipidemia   . Hypertension   . Depression   . Anxiety   . BPH (benign prostatic hypertrophy)     Past Surgical History  Procedure Laterality Date  . Spine surgery    . Carpal tunnel release  March 2013    [name]  . Hernia repair      left inguinal       The following portions of the patient's history were reviewed and updated as appropriate: Allergies, current medications, and problem list.    Review of Systems:   Patient denies headache, fevers, malaise, unintentional weight loss, skin rash, eye pain, sinus congestion and sinus pain, sore throat, dysphagia,  hemoptysis , cough, dyspnea, wheezing, chest pain, palpitations, orthopnea, edema, abdominal pain, nausea, melena, diarrhea, constipation, flank pain, dysuria, hematuria, urinary  Frequency, nocturia, numbness, tingling, seizures,  Focal weakness, Loss of consciousness,  Tremor, insomnia, depression, anxiety, and suicidal ideation.     History   Social History  . Marital Status: Married    Spouse Name:  Connie Balogh    Number of Children: N/A  . Years of Education: N/A   Occupational History  . die cutter operator    Social History Main Topics  . Smoking status: Never Smoker   . Smokeless tobacco: Never Used  . Alcohol Use: No  . Drug Use: No  . Sexual Activity: Not on file   Other Topics Concern  . Not on file   Social History Narrative  . No narrative on file    Objective:  Filed Vitals:   06/25/14 1418  BP: 122/68  Pulse: 101  Temp: 97.8 F (36.6 C)  Resp: 16     General appearance: alert, cooperative and appears stated age Ears: normal TM's and external ear canals both ears Throat: lips, mucosa, and tongue normal; teeth and gums  normal Neck: no adenopathy, no carotid bruit, supple, symmetrical, trachea midline and thyroid not enlarged, symmetric, no tenderness/mass/nodules Back: symmetric, no curvature. ROM normal. No CVA tenderness. Lungs: clear to auscultation bilaterally Heart: regular rate and rhythm, S1, S2 normal, no murmur, click, rub or gallop Abdomen: soft, non-tender; bowel sounds normal; no masses,  no organomegaly Pulses: 2+ and symmetric Skin: Skin color, texture, turgor normal. No rashes or lesions Lymph nodes: Cervical, supraclavicular, and axillary nodes normal.  Assessment and Plan:  Obesity (BMI 30.0-34.9) I have congratulated hr in reduction of   BMI and encouraged  Continued weight loss with goal weight of 214 lbs  To get BMI < 30 using a low glycemic index diet and regular exercise a minimum of 5 days per week.    Hyperplastic colon polyp Biopsy pending.  Follow up colonoscopy Feb 2015 per Dr Loreta Ave  Type II or unspecified type diabetes mellitus with renal manifestations, not stated as uncontrolled Foot exam normal. Eye exam due. Reminders given AGAIN.  a1c is pending . Continue losartan for nephropathy  Lab Results  Component Value Date   HGBA1C 6.1 02/23/2014   Lab Results  Component Value Date   MICROALBUR 51.5* 02/23/2014      Seborrheic dermatitis triamcinolone prescribed as a trial.   Anxiety Not well controlled with celexa and low dose alprazolam.  He is feeling better with twice daily buspirone.     Lumbago with sciatica Chronic, managed with daily cymbalta and tramadol for back pain flare.      Updated Medication List Outpatient Encounter Prescriptions as of 06/25/2014  Medication Sig  . aspirin 81 MG tablet Take 81 mg by mouth daily.    Marland Kitchen atorvastatin (LIPITOR) 20 MG tablet One tablet by mouth every other day  . busPIRone (BUSPAR) 7.5 MG tablet TAKE 1 TABLET (7.5 MG TOTAL) BY MOUTH 3 (THREE) TIMES DAILY.  . cholecalciferol (VITAMIN D) 1000 UNITS tablet Take 1,000  Units by mouth daily.    . DULoxetine (CYMBALTA) 30 MG capsule TAKE ONE CAPSULE BY MOUTH DAILY FOR 1 WEEK, THEN 1 CAPSULE TWICE DAILY  . fish oil-omega-3 fatty acids 1000 MG capsule Take 2 g by mouth daily.    Marland Kitchen gabapentin (NEURONTIN) 300 MG capsule TAKE 3 CAPSULES BY MOUTH 2 TIMES DAILY.  Marland Kitchen Lancets MISC Patient test blood sugar two times a day.  . losartan (COZAAR) 100 MG tablet TAKE 1 TABLET BY MOUTH DAILY  . Multiple Vitamin (MULTIVITAMIN) tablet Take 1 tablet by mouth daily.    Marland Kitchen nystatin (MYCOSTATIN) powder Apply topically 2 (two) times daily.  . QUEtiapine (SEROQUEL) 300 MG tablet Take 1 tablet (300 mg total) by mouth at bedtime.  . tamsulosin (  FLOMAX) 0.4 MG CAPS capsule TAKE 1 CAPSULE EVERY DAY  . traMADol (ULTRAM) 50 MG tablet TAKE 1 TABLET EVERY 8 HOURS AS NEEDED FOR MODERATE PAIN  . [DISCONTINUED] gabapentin (NEURONTIN) 300 MG capsule TAKE 2 CAPSULES BY MOUTH 3 TIMES DAILY.  . [DISCONTINUED] losartan (COZAAR) 100 MG tablet TAKE 1 TABLET BY MOUTH DAILY  . [DISCONTINUED] tamsulosin (FLOMAX) 0.4 MG CAPS TAKE 1 CAPSULE EVERY DAY  . [DISCONTINUED] traMADol (ULTRAM) 50 MG tablet TAKE 1 TABLET EVERY 8 HOURS AS NEEDED FOR MODERATE PAIN  . triamcinolone cream (KENALOG) 0.1 % Apply 1 application topically 2 (two) times daily.  Marland Kitchen zoster vaccine live, PF, (ZOSTAVAX) [id number] UNT/0.65ML injection Inject 19,400 Units into the skin once.  . zoster vaccine live, PF, (ZOSTAVAX) [id number] UNT/0.65ML injection Inject 19,400 Units into the skin once.  . [DISCONTINUED] ALPRAZolam (XANAX) 0.5 MG tablet TAKE 1 TABLET DAILY FOR ANXIETY/ SLEEP  . [DISCONTINUED] citalopram (CELEXA) 20 MG tablet TAKE 1 TABLET BY MOUTH DAILY  . [DISCONTINUED] desonide (DESOWEN) 0.05 % lotion Apply topically 2 (two) times daily.  . [DISCONTINUED] predniSONE (STERAPRED UNI-PAK) 10 MG tablet 6 tablets in the morning on Day 1 , then reduce by 1 tablet daily until gone  . [DISCONTINUED] tamsulosin (FLOMAX) 0.4 MG CAPS capsule TAKE 1  CAPSULE EVERY DAY     Orders Placed This Encounter  Procedures  . Hepatic function panel  . Hemoglobin A1c  . HM DIABETES FOOT EXAM  . HM COLONOSCOPY    Return in about 6 months (around 12/26/2014) for follow up diabetes.

## 2014-06-26 ENCOUNTER — Encounter: Payer: Self-pay | Admitting: Internal Medicine

## 2014-06-26 LAB — HEMOGLOBIN A1C: Hgb A1c MFr Bld: 6.2 % (ref 4.6–6.5)

## 2014-06-26 LAB — HEPATIC FUNCTION PANEL
ALK PHOS: 106 U/L (ref 39–117)
ALT: 28 U/L (ref 0–53)
AST: 27 U/L (ref 0–37)
Albumin: 3.8 g/dL (ref 3.5–5.2)
Bilirubin, Direct: 0.1 mg/dL (ref 0.0–0.3)
Total Bilirubin: 0.4 mg/dL (ref 0.2–1.2)
Total Protein: 6.9 g/dL (ref 6.0–8.3)

## 2014-06-26 NOTE — Assessment & Plan Note (Signed)
triamcinolone prescribed as a trial.

## 2014-06-26 NOTE — Assessment & Plan Note (Signed)
Biopsy pending.  Follow up colonoscopy Feb 2015 per Dr Loreta Ave

## 2014-06-26 NOTE — Assessment & Plan Note (Signed)
Chronic, managed with daily cymbalta and tramadol for back pain flare.

## 2014-06-26 NOTE — Assessment & Plan Note (Signed)
Not well controlled with celexa and low dose alprazolam.  He is feeling better with twice daily buspirone.

## 2014-06-26 NOTE — Assessment & Plan Note (Signed)
Foot exam normal. Eye exam due. Reminders given AGAIN.  a1c is pending . Continue losartan for nephropathy  Lab Results  Component Value Date   HGBA1C 6.1 02/23/2014   Lab Results  Component Value Date   MICROALBUR 51.5* 02/23/2014

## 2014-06-27 ENCOUNTER — Encounter: Payer: Self-pay | Admitting: Internal Medicine

## 2014-08-18 ENCOUNTER — Ambulatory Visit: Payer: Medicare Other

## 2014-09-19 ENCOUNTER — Ambulatory Visit (INDEPENDENT_AMBULATORY_CARE_PROVIDER_SITE_OTHER): Payer: Medicare Other | Admitting: Internal Medicine

## 2014-09-19 ENCOUNTER — Encounter: Payer: Self-pay | Admitting: Internal Medicine

## 2014-09-19 VITALS — BP 144/82 | HR 75 | Temp 98.7°F | Resp 18 | Ht 71.25 in | Wt 224.8 lb

## 2014-09-19 DIAGNOSIS — E119 Type 2 diabetes mellitus without complications: Secondary | ICD-10-CM

## 2014-09-19 DIAGNOSIS — F329 Major depressive disorder, single episode, unspecified: Secondary | ICD-10-CM

## 2014-09-19 DIAGNOSIS — E785 Hyperlipidemia, unspecified: Secondary | ICD-10-CM

## 2014-09-19 DIAGNOSIS — F489 Nonpsychotic mental disorder, unspecified: Secondary | ICD-10-CM

## 2014-09-19 DIAGNOSIS — E669 Obesity, unspecified: Secondary | ICD-10-CM

## 2014-09-19 DIAGNOSIS — F5105 Insomnia due to other mental disorder: Secondary | ICD-10-CM

## 2014-09-19 DIAGNOSIS — F32A Depression, unspecified: Secondary | ICD-10-CM

## 2014-09-19 DIAGNOSIS — I10 Essential (primary) hypertension: Secondary | ICD-10-CM

## 2014-09-19 MED ORDER — QUETIAPINE FUMARATE 300 MG PO TABS: 300.0000 mg | ORAL_TABLET | Freq: Every day | ORAL | Status: DC

## 2014-09-19 NOTE — Patient Instructions (Signed)
We will try reducing the seroquel to 150 mg by using 1/2 of a 300 mg tablet

## 2014-09-19 NOTE — Progress Notes (Signed)
Patient ID: Austin Rodriguez, male   DOB: 02-12-1946, 68 y.o.   MRN: [id number]  Patient Active Problem List   Diagnosis Date Noted  . Seborrheic dermatitis 06/25/2014  . 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 Obesity (BMI 30.0-34.9) 12/19/2012  . Diffuse pain 11/07/2012  . BPH (benign prostatic hypertrophy)   . Encounter for preventive health examination 05/12/2012  . Atypical chest pain 05/12/2012  . Other abnormal blood chemistry 04/24/2012  . Diabetes mellitus without complication 04/22/2012  . Hyperlipidemia   . Hypertension   . Depression   . Anxiety   . Hyperplastic colon polyp 08/31/2011  . Lumbago with sciatica 08/31/2011  . Screening for prostate cancer 08/31/2011    Subjective:  CC:   Chief Complaint  Patient presents with  . Follow-up    medication    HPI:   Austin Rodriguez is a 68 y.o. male who presents for  3 month Follow up on type 2 DM, hyperlipidemia, and hyperlipidemia.  3Patient has no complaints today.  Patient is following a low glycemic index diet and taking all prescribed medications regularly without side effects.  Fasting sugars have been under less than 140 most of the time and post prandials have been under 160 except on rare occasions. Patient is exercising about 3 times per week and intentionally trying to lose weight .  Patient has had an eye exam in the last 12 months and checks feet regularly for signs of infection.  Patient does not walk barefoot outside,  And denies an numbness tingling or burning in feet. Patient is up to date on all recommended vaccinations  His psychiatrist no longer practicing , has been prescribing seroquel for insomnia for years on 200 mg  At bedtime cymbalta 30 mg once daily .  Not taking buspirone anymore after a trial increasig to 15 mg.     Past Medical History  Diagnosis Date  . cervical spondylosis   . Diabetes mellitus   . Chronic neck pain   . Hyperlipidemia   . Hypertension   . Depression   . Anxiety    . BPH (benign prostatic hypertrophy)     Past Surgical History  Procedure Laterality Date  . Spine surgery    . Carpal tunnel release  March 2013    [name]  . Hernia repair      left inguinal       The following portions of the patient's history were reviewed and updated as appropriate: Allergies, current medications, and problem list.    Review of Systems:   Patient denies headache, fevers, malaise, unintentional weight loss, skin rash, eye pain, sinus congestion and sinus pain, sore throat, dysphagia,  hemoptysis , cough, dyspnea, wheezing, chest pain, palpitations, orthopnea, edema, abdominal pain, nausea, melena, diarrhea, constipation, flank pain, dysuria, hematuria, urinary  Frequency, nocturia, numbness, tingling, seizures,  Focal weakness, Loss of consciousness,  Tremor, insomnia, depression, anxiety, and suicidal ideation.     History   Social History  . Marital Status: Married    Spouse Name: Austin Rodriguez    Number of Children: N/A  . Years of Education: N/A   Occupational History  . die cutter operator    Social History Main Topics  . Smoking status: Never Smoker   . Smokeless tobacco: Never Used  . Alcohol Use: No  . Drug Use: No  . Sexual Activity: Not on file   Other Topics Concern  . Not on file   Social History Narrative  Objective:  Filed Vitals:   09/19/14 1603  BP: 144/82  Pulse: 75  Temp: 98.7 F (37.1 C)  Resp: 18     General appearance: alert, cooperative and appears stated age Ears: normal TM's and external ear canals both ears Throat: lips, mucosa, and tongue normal; teeth and gums normal Neck: no adenopathy, no carotid bruit, supple, symmetrical, trachea midline and thyroid not enlarged, symmetric, no tenderness/mass/nodules Back: symmetric, no curvature. ROM normal. No CVA tenderness. Lungs: clear to auscultation bilaterally Heart: regular rate and rhythm, S1, S2 normal, no murmur, click, rub or gallop Abdomen:  soft, non-tender; bowel sounds normal; no masses,  no organomegaly Pulses: 2+ and symmetric Skin: Skin color, texture, turgor normal. No rashes or lesions Lymph nodes: Cervical, supraclavicular, and axillary nodes normal.  Assessment and Plan:  Diabetes mellitus without complication Foot exam normal. Eye exam due. Reminders given AGAIN.  a1c is pending . Continue losartan for nephropathy and return in a week for follow up labs.  Lab Results  Component Value Date   HGBA1C 6.2 06/25/2014   Lab Results  Component Value Date   MICROALBUR 51.5* 02/23/2014        Obesity (BMI 30.0-34.9) I have addressed  BMI and recommended a low glycemic index diet utilizing smaller more frequent meals to increase metabolism.  I have also recommended that patient start exercising with a goal of 30 minutes of aerobic exercise a minimum of 5 days per week. Screening for lipid disorders, thyroid and diabetes to be done today.    Insomnia due to mental condition Discussed trial of reduction in seroquel to 150 mg daily   Depression contineu cymbalta.no changes today    Updated Medication List Outpatient Encounter Prescriptions as of 09/19/2014  Medication Sig  . aspirin 81 MG tablet Take 81 mg by mouth daily.    Marland Kitchen. atorvastatin (LIPITOR) 20 MG tablet One tablet by mouth every other day  . cholecalciferol (VITAMIN D) 1000 UNITS tablet Take 1,000 Units by mouth daily.    . DULoxetine (CYMBALTA) 30 MG capsule TAKE ONE CAPSULE BY MOUTH DAILY FOR 1 WEEK, THEN 1 CAPSULE TWICE DAILY  . fish oil-omega-3 fatty acids 1000 MG capsule Take 2 g by mouth daily.    Marland Kitchen. gabapentin (NEURONTIN) 300 MG capsule TAKE 3 CAPSULES BY MOUTH 2 TIMES DAILY.  Marland Kitchen. Lancets MISC Patient test blood sugar two times a day.  . losartan (COZAAR) 100 MG tablet TAKE 1 TABLET BY MOUTH DAILY  . Multiple Vitamin (MULTIVITAMIN) tablet Take 1 tablet by mouth daily.    Marland Kitchen. nystatin (MYCOSTATIN) powder Apply topically 2 (two) times daily.  .  QUEtiapine (SEROQUEL) 300 MG tablet Take 1 tablet (300 mg total) by mouth at bedtime.  . tamsulosin (FLOMAX) 0.4 MG CAPS capsule TAKE 1 CAPSULE EVERY DAY  . traMADol (ULTRAM) 50 MG tablet TAKE 1 TABLET EVERY 8 HOURS AS NEEDED FOR MODERATE PAIN  . triamcinolone cream (KENALOG) 0.1 % Apply 1 application topically 2 (two) times daily.  Marland Kitchen. zoster vaccine live, PF, (ZOSTAVAX) [id number] UNT/0.65ML injection Inject 19,400 Units into the skin once.  . zoster vaccine live, PF, (ZOSTAVAX) [id number] UNT/0.65ML injection Inject 19,400 Units into the skin once.  . [DISCONTINUED] busPIRone (BUSPAR) 7.5 MG tablet TAKE 1 TABLET (7.5 MG TOTAL) BY MOUTH 3 (THREE) TIMES DAILY.  . [DISCONTINUED] QUEtiapine (SEROQUEL) 300 MG tablet Take 1 tablet (300 mg total) by mouth at bedtime.     Orders Placed This Encounter  Procedures  . Comprehensive metabolic panel  .  Hemoglobin A1c  . Lipid panel  . Microalbumin / creatinine urine ratio    Return for follow up diabetes.

## 2014-09-19 NOTE — Progress Notes (Signed)
Pre-visit discussion using our clinic review tool. No additional management support is needed unless otherwise documented below in the visit note.  

## 2014-09-23 NOTE — Assessment & Plan Note (Signed)
I have addressed  BMI and recommended a low glycemic index diet utilizing smaller more frequent meals to increase metabolism.  I have also recommended that patient start exercising with a goal of 30 minutes of aerobic exercise a minimum of 5 days per week. Screening for lipid disorders, thyroid and diabetes to be done today.   

## 2014-09-23 NOTE — Assessment & Plan Note (Signed)
Foot exam normal. Eye exam due. Reminders given AGAIN.  a1c is pending . Continue losartan for nephropathy and return in a week for follow up labs.  Lab Results  Component Value Date   HGBA1C 6.2 06/25/2014   Lab Results  Component Value Date   MICROALBUR 51.5* 02/23/2014

## 2014-09-23 NOTE — Assessment & Plan Note (Signed)
Discussed trial of reduction in seroquel to 150 mg daily

## 2014-09-23 NOTE — Assessment & Plan Note (Signed)
contineu cymbalta.no changes today

## 2014-10-03 ENCOUNTER — Other Ambulatory Visit: Payer: Self-pay

## 2014-10-03 MED ORDER — ALPRAZOLAM 0.5 MG PO TABS: ORAL_TABLET | ORAL | Status: DC

## 2014-10-03 NOTE — Telephone Encounter (Signed)
Ok to refill, 

## 2014-10-03 NOTE — Telephone Encounter (Signed)
Ok refill? Last visit 09/19/14

## 2014-10-03 NOTE — Telephone Encounter (Signed)
The pt called and is hoping to get a refill of his xanax  Thanks!

## 2014-10-04 NOTE — Telephone Encounter (Signed)
Called to pharmacy 

## 2014-10-12 ENCOUNTER — Other Ambulatory Visit: Payer: Self-pay | Admitting: *Deleted

## 2014-10-12 MED ORDER — GABAPENTIN 300 MG PO CAPS: 600.0000 mg | ORAL_CAPSULE | Freq: Three times a day (TID) | ORAL | Status: DC

## 2014-11-06 ENCOUNTER — Other Ambulatory Visit: Payer: Self-pay | Admitting: *Deleted

## 2014-11-06 DIAGNOSIS — E785 Hyperlipidemia, unspecified: Secondary | ICD-10-CM

## 2014-11-06 MED ORDER — ATORVASTATIN CALCIUM 20 MG PO TABS: ORAL_TABLET | ORAL | Status: DC

## 2014-11-07 ENCOUNTER — Other Ambulatory Visit: Payer: Self-pay | Admitting: *Deleted

## 2014-11-08 ENCOUNTER — Other Ambulatory Visit: Payer: Self-pay | Admitting: Internal Medicine

## 2014-11-08 MED ORDER — TRAMADOL HCL 50 MG PO TABS: ORAL_TABLET | ORAL | Status: DC

## 2014-11-08 NOTE — Telephone Encounter (Signed)
Patient pharmacy called requesting refill on Tramadol Please advise OK to fill? Last fill was 8/15 and last OV was 11/15.

## 2014-11-20 ENCOUNTER — Other Ambulatory Visit: Payer: Self-pay | Admitting: Internal Medicine

## 2014-12-01 ENCOUNTER — Other Ambulatory Visit: Payer: Self-pay | Admitting: Internal Medicine

## 2014-12-03 ENCOUNTER — Other Ambulatory Visit: Payer: Self-pay | Admitting: Internal Medicine

## 2014-12-03 ENCOUNTER — Telehealth: Payer: Self-pay | Admitting: Internal Medicine

## 2014-12-03 MED ORDER — DULOXETINE HCL 30 MG PO CPEP: 60.0000 mg | ORAL_CAPSULE | Freq: Every day | ORAL | Status: DC

## 2014-12-03 NOTE — Telephone Encounter (Signed)
Patient called and stated he has been out of Cymbalta for 2 weeks and needs refill due to [facility] kept trying refill through old primary may I refill?

## 2014-12-03 NOTE — Telephone Encounter (Signed)
60 mg daily dose refilled

## 2014-12-04 NOTE — Telephone Encounter (Signed)
Patient notified

## 2014-12-27 ENCOUNTER — Other Ambulatory Visit: Payer: Self-pay | Admitting: Internal Medicine

## 2014-12-27 NOTE — Telephone Encounter (Signed)
Please confirm with patient by calling him so we do not fill the wrong amount

## 2014-12-27 NOTE — Telephone Encounter (Signed)
It appears at 11.18.15 OV Seroquel was reduced to 150 mg from 300 mg.  Please advise refill

## 2014-12-28 MED ORDER — QUETIAPINE FUMARATE 300 MG PO TABS: 150.0000 mg | ORAL_TABLET | Freq: Every day | ORAL | Status: DC

## 2014-12-28 NOTE — Telephone Encounter (Signed)
60 day supply authorized and sent

## 2014-12-28 NOTE — Addendum Note (Signed)
Addended by: Sherlene ShamsULLO, Sneha Willig L on: 12/28/2014 03:24 PM   Modules accepted: Orders

## 2015-01-01 ENCOUNTER — Other Ambulatory Visit: Payer: Self-pay | Admitting: *Deleted

## 2015-01-01 ENCOUNTER — Other Ambulatory Visit: Payer: Self-pay | Admitting: Internal Medicine

## 2015-01-07 ENCOUNTER — Other Ambulatory Visit: Payer: Self-pay | Admitting: Internal Medicine

## 2015-01-08 NOTE — Telephone Encounter (Signed)
Last OV 11.18.15, last refill 1.7.16.  Please advise refill

## 2015-01-09 NOTE — Telephone Encounter (Signed)
Faxed to pharmacy

## 2015-01-09 NOTE — Telephone Encounter (Signed)
Ok to refill,  printed rx  

## 2015-01-15 ENCOUNTER — Ambulatory Visit (INDEPENDENT_AMBULATORY_CARE_PROVIDER_SITE_OTHER): Payer: Medicare Other | Admitting: Internal Medicine

## 2015-01-15 ENCOUNTER — Encounter: Payer: Self-pay | Admitting: Internal Medicine

## 2015-01-15 ENCOUNTER — Telehealth: Payer: Self-pay | Admitting: Internal Medicine

## 2015-01-15 ENCOUNTER — Emergency Department: Payer: Self-pay | Admitting: Emergency Medicine

## 2015-01-15 VITALS — BP 122/64 | HR 93 | Temp 98.0°F | Resp 16 | Ht 73.0 in | Wt 212.0 lb

## 2015-01-15 DIAGNOSIS — E119 Type 2 diabetes mellitus without complications: Secondary | ICD-10-CM

## 2015-01-15 DIAGNOSIS — R21 Rash and other nonspecific skin eruption: Secondary | ICD-10-CM

## 2015-01-15 DIAGNOSIS — R3589 Other polyuria: Secondary | ICD-10-CM

## 2015-01-15 DIAGNOSIS — R358 Other polyuria: Secondary | ICD-10-CM

## 2015-01-15 DIAGNOSIS — I10 Essential (primary) hypertension: Secondary | ICD-10-CM

## 2015-01-15 DIAGNOSIS — E785 Hyperlipidemia, unspecified: Secondary | ICD-10-CM

## 2015-01-15 LAB — POCT URINALYSIS DIPSTICK
BILIRUBIN UA: NEGATIVE
Blood, UA: NEGATIVE
Glucose, UA: >=1000
KETONES UA: NEGATIVE
Leukocytes, UA: NEGATIVE
Nitrite, UA: NEGATIVE
Protein, UA: 30
SPEC GRAV UA: 1.01
Urobilinogen, UA: 0.2
pH, UA: 5.5

## 2015-01-15 LAB — BASIC METABOLIC PANEL
BUN: 23 mg/dL — AB (ref 4–21)
CREATININE: 1.3 mg/dL (ref 0.6–1.3)
GLUCOSE: 557 mg/dL
SODIUM: 131 mmol/L — AB (ref 137–147)

## 2015-01-15 LAB — CBC AND DIFFERENTIAL
HCT: 40 % — AB (ref 41–53)
Hemoglobin: 13.7 g/dL (ref 13.5–17.5)
Platelets: 163 10*3/uL (ref 150–399)
WBC: 6 10*3/mL

## 2015-01-15 MED ORDER — TAMSULOSIN HCL 0.4 MG PO CAPS: ORAL_CAPSULE | ORAL | Status: DC

## 2015-01-15 MED ORDER — LOSARTAN POTASSIUM 100 MG PO TABS: ORAL_TABLET | ORAL | Status: AC

## 2015-01-15 MED ORDER — TRIAMCINOLONE ACETONIDE 0.1 % EX CREA: 1.0000 "application " | TOPICAL_CREAM | Freq: Two times a day (BID) | CUTANEOUS | Status: AC

## 2015-01-15 NOTE — Progress Notes (Signed)
Pre-visit discussion using our clinic review tool. No additional management support is needed unless otherwise documented below in the visit note.  

## 2015-01-15 NOTE — Telephone Encounter (Signed)
emmi emailed °

## 2015-01-15 NOTE — Patient Instructions (Signed)
Congratulations on the weight loss!  YOur Body mass index is 27.98 kg/(m^2).   You can try the triamcinolone cream for your genital itching instead of benadryl  PLEASE GET your eyes examanied asap!

## 2015-01-15 NOTE — Telephone Encounter (Signed)
Spoke with pt, he states he is at the ER for evaluation

## 2015-01-15 NOTE — Telephone Encounter (Signed)
Nurse: Renaldo FiddlerAdkins, RN, Raynelle FanningJulie Date/Time Lamount Cohen(Eastern Time): 01/15/2015 3:13:27 PM  Confirm and document reason for call. If symptomatic, describe symptoms. ---Caller states her husband just left the office, saw Dr. Darrick Huntsmanullo for a well check because they are moving, and he had lost 12 lbs since January. He has a hx of diabetes, is having excessive thirst, blurred vision and fatigue, and blood work was drawn. When he got home she checked his BS, she is a diabetic, and the monitor reads "high" which indicates over 600.  Has the patient traveled out of the country within the last 30 days? ---Not Applicable  Does the patient require triage? ---Yes  Related visit to physician within the last 2 weeks? ---Yes   FU visit today  Does the PT have any chronic conditions? (i.e. diabetes, asthma, etc.) ---Yes  List chronic conditions. ---hx diabetes, htn, high cholesterol, neuropathy, insomnia, anxiety/depression     Guidelines    Guideline Title Affirmed Question Affirmed Notes  Diabetes - High Blood Sugar Blood glucose > 500 mg/dl (13.227.5 mmol/l)    Final Disposition User   Go to ED Now (or PCP triage) Renaldo FiddlerAdkins, RN, Raynelle FanningJulie

## 2015-01-16 ENCOUNTER — Telehealth: Payer: Self-pay | Admitting: *Deleted

## 2015-01-16 ENCOUNTER — Telehealth: Payer: Self-pay | Admitting: Internal Medicine

## 2015-01-16 LAB — COMPREHENSIVE METABOLIC PANEL
ALK PHOS: 167 U/L — AB (ref 39–117)
ALT: 19 U/L (ref 0–53)
AST: 17 U/L (ref 0–37)
Albumin: 4.3 g/dL (ref 3.5–5.2)
BILIRUBIN TOTAL: 0.6 mg/dL (ref 0.2–1.2)
BUN: 23 mg/dL (ref 6–23)
CO2: 24 meq/L (ref 19–32)
CREATININE: 1.48 mg/dL (ref 0.40–1.50)
Calcium: 9.3 mg/dL (ref 8.4–10.5)
Chloride: 97 mEq/L (ref 96–112)
GFR: 50.22 mL/min — ABNORMAL LOW (ref >60.00)
Glucose, Bld: 665 mg/dL (ref 70–99)
Potassium: 4.8 mEq/L (ref 3.5–5.1)
Sodium: 131 mEq/L — ABNORMAL LOW (ref 135–145)
Total Protein: 7 g/dL (ref 6.0–8.3)

## 2015-01-16 LAB — LDL CHOLESTEROL, DIRECT: LDL DIRECT: 61 mg/dL

## 2015-01-16 LAB — MICROALBUMIN / CREATININE URINE RATIO
CREATININE, U: 44.6 mg/dL
MICROALB/CREAT RATIO: 37.2 mg/g — AB (ref 0.0–30.0)
Microalb, Ur: 16.6 mg/dL — ABNORMAL HIGH (ref 0.0–1.9)

## 2015-01-16 LAB — LIPID PANEL
CHOL/HDL RATIO: 4
Cholesterol: 111 mg/dL (ref 0–200)
HDL: 25.6 mg/dL — ABNORMAL LOW (ref >39.00)
NonHDL: 85.4
Triglycerides: 268 mg/dL — ABNORMAL HIGH (ref 0.0–149.0)
VLDL: 53.6 mg/dL — ABNORMAL HIGH (ref 0.0–40.0)

## 2015-01-16 LAB — HEMOGLOBIN A1C: HEMOGLOBIN A1C: 12.2 % — AB (ref 4.6–6.5)

## 2015-01-16 NOTE — Telephone Encounter (Signed)
See Dr. Melina Schoolsullo's note below, add to Thursday at 1:00

## 2015-01-16 NOTE — Telephone Encounter (Signed)
CRITICAL    pt glucose is 664

## 2015-01-16 NOTE — Telephone Encounter (Signed)
Patient scheduled for  O'clock tomorrow patient was given insulin to lower CBG but was sent home home on Metformin I sent the Rx to the pharmacy.  Sent for Er discharge summary.

## 2015-01-16 NOTE — Telephone Encounter (Signed)
Patient had his blood sugar done yesterday and it was 564 and he ended up in the ER, but they suggested he see his PCP within the week.  Where would you like for him to be put on the schedule? Wednesday afternoons are not good.

## 2015-01-16 NOTE — Telephone Encounter (Signed)
plesae call Austin Rodriguez and find out if they kept hi or released him,  He will probably need insulin . His a1c is not back yet but he had a lot og glucose in his urine so offer him appt for follow up.  thrusday 1:00.

## 2015-01-16 NOTE — Telephone Encounter (Signed)
Left message for patient to return call to office. 

## 2015-01-16 NOTE — Telephone Encounter (Signed)
Thank you for taking this critical lab  to Dr Lorin PicketScott.   Olegario MessierKathy, please find out if ER started him on any insulin or other medications for diabetes or just sent him out after giving IV fluids .    His A1c was 12.2 so he WILL be starting insulin tomorrow at his appt if he has not already

## 2015-01-17 ENCOUNTER — Encounter: Payer: Self-pay | Admitting: Internal Medicine

## 2015-01-17 ENCOUNTER — Ambulatory Visit (INDEPENDENT_AMBULATORY_CARE_PROVIDER_SITE_OTHER): Payer: Medicare Other | Admitting: Internal Medicine

## 2015-01-17 VITALS — BP 140/70 | HR 85 | Temp 98.2°F | Resp 14 | Ht 71.0 in | Wt 213.5 lb

## 2015-01-17 DIAGNOSIS — R21 Rash and other nonspecific skin eruption: Secondary | ICD-10-CM | POA: Insufficient documentation

## 2015-01-17 DIAGNOSIS — E1165 Type 2 diabetes mellitus with hyperglycemia: Secondary | ICD-10-CM

## 2015-01-17 DIAGNOSIS — IMO0002 Reserved for concepts with insufficient information to code with codable children: Secondary | ICD-10-CM

## 2015-01-17 DIAGNOSIS — E1139 Type 2 diabetes mellitus with other diabetic ophthalmic complication: Secondary | ICD-10-CM

## 2015-01-17 MED ORDER — GLIPIZIDE 5 MG PO TABS: 5.0000 mg | ORAL_TABLET | Freq: Two times a day (BID) | ORAL | Status: AC

## 2015-01-17 NOTE — Assessment & Plan Note (Addendum)
At time of last visit his DM was controlled on low GI diet .  Currently endorsing symptoms of uncontrolled DM and has glucosuria by dipstick UA today.  Labs were ordered,  And after patient returned home, he was referred to the ER for cbg of 600.  Lab Results  Component Value Date   HGBA1C 12.2* 01/15/2015   Lab Results  Component Value Date   MICROALBUR 16.6* 01/15/2015

## 2015-01-17 NOTE — Assessment & Plan Note (Signed)
He haws continued itching and irritation of his inguinal area with no evidence of candida on exam.  Trial of traimcinolone.

## 2015-01-17 NOTE — Progress Notes (Signed)
Pre-visit discussion using our clinic review tool. No additional management support is needed unless otherwise documented below in the visit note.  

## 2015-01-17 NOTE — Assessment & Plan Note (Signed)
New ACC guidelines recommend starting patients aged 10340 or higher on moderate intensity statin therapy for diabetes and concurrent LDL between 70-189.  He is tolerating statin therapy without liver enzye elevation or myalgias.  Lab Results  Component Value Date   CHOL 111 01/15/2015   HDL 25.60* 01/15/2015   LDLCALC 81 02/23/2014   LDLDIRECT 61.0 01/15/2015   TRIG 268.0* 01/15/2015   CHOLHDL 4 01/15/2015    Lab Results  Component Value Date   ALT 19 01/15/2015   AST 17 01/15/2015   ALKPHOS 167* 01/15/2015   BILITOT 0.6 01/15/2015

## 2015-01-17 NOTE — Progress Notes (Signed)
Patient ID: Austin Rodriguez, male   DOB: July 03, 1946, 69 y.o.   MRN: [id number]  Patient Active Problem List   Diagnosis Date Noted  . Rash and nonspecific skin eruption 01/17/2015  . Seborrheic dermatitis 06/25/2014  . 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 Obesity (BMI 30.0-34.9) 12/19/2012  . Diffuse pain 11/07/2012  . BPH (benign prostatic hypertrophy)   . Encounter for preventive health examination 05/12/2012  . Atypical chest pain 05/12/2012  . Other abnormal blood chemistry 04/24/2012  . Diabetes mellitus without complication 04/22/2012  . Hyperlipidemia   . Hypertension   . Depression   . Anxiety   . Hyperplastic colon polyp 08/31/2011  . Lumbago with sciatica 08/31/2011  . Screening for prostate cancer 08/31/2011    Subjective:  CC:   Chief Complaint  Patient presents with  . Follow-up    medication refills patient is leaving town and would like a couple months refills.    HPI:   Austin Rodriguez is a 69 y.o. male who presents for follow up on diet controlled DM,  hyperlipidemia, hypertension and obesity.  Last seen in October,  Did not return for labs  Last labs august 2015 and a1c was 6.2 at that time. .  Reports unintentional weight loss despite dietary noncomplaince with low GI diet, accompanied by polyuria and polydipsia , increased craving for sweets.  Does not check blood sugars regularly,  Although he thinks his fastings have been around 140 and post prandials < 200.  Not sure when he last checked it.  He is moving to [place], [place] in a few weeks to be cloaser to hic young grandchildren and is requesting refills for 90 days on all of his medications.  He is also reporting persistent itching and irritation of his inguinal area that has not responded to nystatin ointment.    Past Medical History  Diagnosis Date  . cervical spondylosis   . Diabetes mellitus   . Chronic neck pain   . Hyperlipidemia   . Hypertension   . Depression   . Anxiety   . BPH (benign  prostatic hypertrophy)     Past Surgical History  Procedure Laterality Date  . Spine surgery    . Carpal tunnel release  March 2013    [name]  . Hernia repair      left inguinal       The following portions of the patient's history were reviewed and updated as appropriate: Allergies, current medications, and problem list.    Review of Systems:   Patient denies headache, fevers, malaise,, skin rash, eye pain, sinus congestion and sinus pain, sore throat, dysphagia,  hemoptysis , cough, dyspnea, wheezing, chest pain, palpitations, orthopnea, edema, abdominal pain, nausea, melena, diarrhea, constipation, flank pain, dysuria, hematuria,  numbness, tingling, seizures,  Focal weakness, Loss of consciousness,  Tremor,  depression, anxiety, and suicidal ideation.     History   Social History  . Marital Status: Married    Spouse Name: Junious Dresser Hornak  . Number of Children: N/A  . Years of Education: N/A   Occupational History  . die cutter operator    Social History Main Topics  . Smoking status: Never Smoker   . Smokeless tobacco: Never Used  . Alcohol Use: No  . Drug Use: No  . Sexual Activity: Not on file   Other Topics Concern  . Not on file   Social History Narrative    Objective:  Filed Vitals:   01/15/15 1346  BP:  122/64  Pulse: 93  Temp: 98 F (36.7 C)  Resp: 16     General appearance: alert, cooperative and appears stated age Ears: normal TM's and external ear canals both ears Throat: lips, mucosa, and tongue normal; teeth and gums normal Neck: no adenopathy, no carotid bruit, supple, symmetrical, trachea midline and thyroid not enlarged, symmetric, no tenderness/mass/nodules Back: symmetric, no curvature. ROM normal. No CVA tenderness. Lungs: clear to auscultation bilaterally Heart: regular rate and rhythm, S1, S2 normal, no murmur, click, rub or gallop Abdomen: soft, non-tender; bowel sounds normal; no masses,  no organomegaly Pulses: 2+ and  symmetric Skin: Skin color, texture, turgor normal. Mild erthema of inguinal folds without scaling , warmth or papules Lymph nodes: Cervical, supraclavicular, and axillary nodes normal.  Assessment and Plan:  Diabetes mellitus without complication At time of last visit his DM was controlled on low GI diet .  Currently endorsing symptoms of uncontrolled DM and has glucosuria by dipstick UA today.  Labs were ordered,  And after patient returned home, he was referred to the ER for cbg of 600.  Lab Results  Component Value Date   HGBA1C 12.2* 01/15/2015   Lab Results  Component Value Date   MICROALBUR 16.6* 01/15/2015      Hypertension Well controlled on current regimen. Renal function stable, no changes today.  Lab Results  Component Value Date   CREATININE 1.48 01/15/2015   Lab Results  Component Value Date   NA 131* 01/15/2015   K 4.8 01/15/2015   CL 97 01/15/2015   CO2 24 01/15/2015      Hyperlipidemia New ACC guidelines recommend starting patients aged 60 or higher on moderate intensity statin therapy for diabetes and concurrent LDL between 70-189.  He is tolerating statin therapy without liver enzye elevation or myalgias.  Lab Results  Component Value Date   CHOL 111 01/15/2015   HDL 25.60* 01/15/2015   LDLCALC 81 02/23/2014   LDLDIRECT 61.0 01/15/2015   TRIG 268.0* 01/15/2015   CHOLHDL 4 01/15/2015    Lab Results  Component Value Date   ALT 19 01/15/2015   AST 17 01/15/2015   ALKPHOS 167* 01/15/2015   BILITOT 0.6 01/15/2015      Rash and nonspecific skin eruption He haws continued itching and irritation of his inguinal area with no evidence of candida on exam.  Trial of traimcinolone.      Updated Medication List Outpatient Encounter Prescriptions as of 01/15/2015  Medication Sig  . ALPRAZolam (XANAX) 0.5 MG tablet TAKE 1 TABLET BY MOUTH EVERY DAY AS NEEDED FOR ANXIETY OR SLEEP  . aspirin 81 MG tablet Take 81 mg by mouth daily.    Marland Kitchen  atorvastatin (LIPITOR) 20 MG tablet One tablet by mouth every other day  . cholecalciferol (VITAMIN D) 1000 UNITS tablet Take 1,000 Units by mouth daily.    . DULoxetine (CYMBALTA) 30 MG capsule Take 2 capsules (60 mg total) by mouth daily.  . fish oil-omega-3 fatty acids 1000 MG capsule Take 2 g by mouth daily.    Marland Kitchen gabapentin (NEURONTIN) 300 MG capsule Take 2 capsules (600 mg total) by mouth 3 (three) times daily.  . Lancets MISC Patient test blood sugar two times a day.  . losartan (COZAAR) 100 MG tablet TAKE 1 TABLET BY MOUTH DAILY  . Multiple Vitamin (MULTIVITAMIN) tablet Take 1 tablet by mouth daily.    . QUEtiapine (SEROQUEL) 300 MG tablet Take 0.5 tablets (150 mg total) by mouth at bedtime. Take 1/2 tablet by  mouth at bedtime  . tamsulosin (FLOMAX) 0.4 MG CAPS capsule TAKE 1 CAPSULE EVERY DAY  . traMADol (ULTRAM) 50 MG tablet TAKE 1 TABLET BY MOUTH EVERY 8 HOURS AS NEEDED FOR MODERATE PAIN  . triamcinolone cream (KENALOG) 0.1 % Apply 1 application topically 2 (two) times daily.  . [DISCONTINUED] losartan (COZAAR) 100 MG tablet TAKE 1 TABLET BY MOUTH DAILY  . [DISCONTINUED] losartan (COZAAR) 100 MG tablet TAKE 1 TABLET BY MOUTH DAILY  . [DISCONTINUED] losartan (COZAAR) 100 MG tablet TAKE 1 TABLET BY MOUTH DAILY  . [DISCONTINUED] nystatin (MYCOSTATIN) powder Apply topically 2 (two) times daily.  . [DISCONTINUED] tamsulosin (FLOMAX) 0.4 MG CAPS capsule TAKE 1 CAPSULE EVERY DAY  . [DISCONTINUED] tamsulosin (FLOMAX) 0.4 MG CAPS capsule TAKE 1 CAPSULE EVERY DAY  . [DISCONTINUED] triamcinolone cream (KENALOG) 0.1 % Apply 1 application topically 2 (two) times daily.  . [DISCONTINUED] zoster vaccine live, PF, (ZOSTAVAX) [id number] UNT/0.65ML injection Inject 19,400 Units into the skin once.  . [DISCONTINUED] zoster vaccine live, PF, (ZOSTAVAX) [id number] UNT/0.65ML injection Inject 19,400 Units into the skin once.     Orders Placed This Encounter  Procedures  . Urinalysis, dipstick only  . LDL  cholesterol, direct  . POCT urinalysis dipstick  . HM DIABETES EYE EXAM    No Follow-up on file.

## 2015-01-17 NOTE — Progress Notes (Signed)
Patient ID: Austin Rodriguez, male   DOB: 1946/07/13, 69 y.o.   MRN: [id number]  Patient Active Problem List   Diagnosis Date Noted  . Rash and nonspecific skin eruption 01/17/2015  . Seborrheic dermatitis 06/25/2014  . 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 Obesity (BMI 30.0-34.9) 12/19/2012  . Diffuse pain 11/07/2012  . BPH (benign prostatic hypertrophy)   . Encounter for preventive health examination 05/12/2012  . Atypical chest pain 05/12/2012  . Other abnormal blood chemistry 04/24/2012  . DM (diabetes mellitus) type II uncontrolled with eye manifestation 04/22/2012  . Hyperlipidemia   . Hypertension   . Depression   . Anxiety   . Hyperplastic colon polyp 08/31/2011  . Lumbago with sciatica 08/31/2011  . Screening for prostate cancer 08/31/2011    Subjective:  CC:   Chief Complaint  Patient presents with  . Follow-up    ER follow up CBG = fasting 207 today  . Diabetes    HPI:   Austin Rodriguez is a 69 y.o. male who presents for Follow up on ER visit for hyperosmolar crisis.  Blood sugar of 600.  Patient was treated with IV hydration given, 3 L iv fluids,  And one dose of insulin  And observed for  6 hrs,  CBG was 234 when he left.  Was 560- when he entered. Metformin 1000 mg bid started 36 hours ago. Last couple of BS : 234 yesterday am fasting .  Yesterday PM 200 was post lunch pre dinner, this morning 207 . He has resumed a low glycemic index diet.   Lab Results  Component Value Date   HGBA1C 12.2* 01/15/2015      Past Medical History  Diagnosis Date  . cervical spondylosis   . Diabetes mellitus   . Chronic neck pain   . Hyperlipidemia   . Hypertension   . Depression   . Anxiety   . BPH (benign prostatic hypertrophy)     Past Surgical History  Procedure Laterality Date  . Spine surgery    . Carpal tunnel release  March 2013    [name]  . Hernia repair      left inguinal       The following portions of the patient's history were  reviewed and updated as appropriate: Allergies, current medications, and problem list.    Review of Systems:   Patient denies headache, fevers, malaise, unintentional weight loss, skin rash, eye pain, sinus congestion and sinus pain, sore throat, dysphagia,  hemoptysis , cough, dyspnea, wheezing, chest pain, palpitations, orthopnea, edema, abdominal pain, nausea, melena, diarrhea, constipation, flank pain, dysuria, hematuria, urinary  Frequency, nocturia, numbness, tingling, seizures,  Focal weakness, Loss of consciousness,  Tremor, insomnia, depression, anxiety, and suicidal ideation.     History   Social History  . Marital Status: Married    Spouse Name: Junious Dresser [place]fton  . Number of Children: N/A  . Years of Education: N/A   Occupational History  . die cutter operator    Social History Main Topics  . Smoking status: Never Smoker   . Smokeless tobacco: Never Used  . Alcohol Use: No  . Drug Use: No  . Sexual Activity: Not on file   Other Topics Concern  . Not on file   Social History Narrative    Objective:  Filed Vitals:   01/17/15 1309  BP: 140/70  Pulse: 85  Temp: 98.2 F (36.8 C)  Resp: 14     General appearance: alert, cooperative and appears  stated age Ears: normal TM's and external ear canals both ears Throat: lips, mucosa, and tongue normal; teeth and gums normal Neck: no adenopathy, no carotid bruit, supple, symmetrical, trachea midline and thyroid not enlarged, symmetric, no tenderness/mass/nodules Back: symmetric, no curvature. ROM normal. No CVA tenderness. Lungs: clear to auscultation bilaterally Heart: regular rate and rhythm, S1, S2 normal, no murmur, click, rub or gallop Abdomen: soft, non-tender; bowel sounds normal; no masses,  no organomegaly Pulses: 2+ and symmetric Skin: Skin color, texture, turgor normal. No rashes or lesions Lymph nodes: Cervical, supraclavicular, and axillary nodes normal.  Assessment and Plan:  DM (diabetes mellitus)  type II uncontrolled with eye manifestation At time of last visit his DM was controlled on low GI diet .  Currently loss of control with glucosuria, pseuohyponatremia and hperosnolar crisis now resolved. Continue glipizide and metformin and follow BS for next two weeks on low GI diet.  Will add NPH if needed. .  Lab Results  Component Value Date   HGBA1C 12.2* 01/15/2015   Lab Results  Component Value Date   MICROALBUR 16.6* 01/15/2015       A total of 25 minutes of face to face time was spent with patient more than half of which was spent in counselling about the above mentioned conditions  and coordination of care   Updated Medication List Outpatient Encounter Prescriptions as of 01/17/2015  Medication Sig  . ALPRAZolam (XANAX) 0.5 MG tablet TAKE 1 TABLET BY MOUTH EVERY DAY AS NEEDED FOR ANXIETY OR SLEEP  . aspirin 81 MG tablet Take 81 mg by mouth daily.    Marland Kitchen. atorvastatin (LIPITOR) 20 MG tablet One tablet by mouth every other day  . cholecalciferol (VITAMIN D) 1000 UNITS tablet Take 1,000 Units by mouth daily.    . DULoxetine (CYMBALTA) 30 MG capsule Take 2 capsules (60 mg total) by mouth daily.  . fish oil-omega-3 fatty acids 1000 MG capsule Take 2 g by mouth daily.    Marland Kitchen. gabapentin (NEURONTIN) 300 MG capsule Take 2 capsules (600 mg total) by mouth 3 (three) times daily.  . Lancets MISC Patient test blood sugar two times a day.  . losartan (COZAAR) 100 MG tablet TAKE 1 TABLET BY MOUTH DAILY  . metFORMIN (GLUCOPHAGE) 1000 MG tablet Take 1,000 mg by mouth 2 (two) times daily with a meal.  . Multiple Vitamin (MULTIVITAMIN) tablet Take 1 tablet by mouth daily.    . QUEtiapine (SEROQUEL) 300 MG tablet Take 0.5 tablets (150 mg total) by mouth at bedtime. Take 1/2 tablet by mouth at bedtime  . tamsulosin (FLOMAX) 0.4 MG CAPS capsule TAKE 1 CAPSULE EVERY DAY  . traMADol (ULTRAM) 50 MG tablet TAKE 1 TABLET BY MOUTH EVERY 8 HOURS AS NEEDED FOR MODERATE PAIN  . triamcinolone cream (KENALOG)  0.1 % Apply 1 application topically 2 (two) times daily.  Marland Kitchen. glipiZIDE (GLUCOTROL) 5 MG tablet Take 1 tablet (5 mg total) by mouth 2 (two) times daily before a meal.     No orders of the defined types were placed in this encounter.    No Follow-up on file.

## 2015-01-17 NOTE — Patient Instructions (Signed)
Continue metformin 1000 mg twice daily.  Adding glipizide 1/2 tablet twice daily  Use Mychart to update me in one month ,  You may increase glipzide on your own to 1 tablet twice  Daily.   I have found a more low carb items to add to the diet.   7 AM Breakfast:  Choose from the following:  < 5 carbs  Weekdays: Low carbohydrate Protein  Shakes (EAS AdvantEdge "Carb Control" shakes, Atkins,  Muscle Milk or Premier Protein shakes)     Weekends:  a scrambled egg/bacon/cheese burrito made with Mission's "carb  balance" whole wheat tortilla  (about 10 net carbs )  Eggs,  bacon /sausage , Joseph's pita /lavash bread or  (5 carbs)  A slice of fritatta ( egg based baked dish, no  crust:  google it) (< 10 carbs)   Avoid cereal and bananas, oatmeal and cream of wheat and grits. They are loaded with carbohydrates!   10 AM: high protein snack  (< 5 carbs)   Protein bar by Atkins  Or KIND  (the snack size, < 200 cal, usually < 6 carbs    A stick of cheese:  Around 1 carb,  100 cal      Other so called "protein bars" tend to be loaded with carbohydrates.  Remember, in food advertising, the word "energy" is synonymous for " carbohydrate."  Lunch:   A Sandwich using the bread choices listed, Can use any  Eggs,  lunchmeat, grilled meat or canned tuna).  Can add avocado, regular mayo/mustard  and cheese.  A Salad using blue cheese, ranch,  Goddess dressing  or vinagrette,  No croutons or "confetti" and no "candied nuts" but regular nuts OK.   2 HARD BOILED EGG WHITES AND A CUP OF one of these greek yogurts:    dannon lt n fit greek yogurt         chobani 100 greek yogurt,    Oikos triple zero greek yogurt       No pretzels or chips.  Pickles and miniature sweet peppers are a good low  carb alternative that provide a "crunch"  The bread is the only source of carbohydrate in a sandwich and  can be decreased by trying some of these alternatives to traditional loaf bread:   Joseph's pita bread and Lavash  (flat) bread :  50 cal and 4 net carbs  available at [employer]Js and [employer].  Taste better when toasted, use as pita chips  Touf[employer] makes a variety of  flatbreads and  A PITA POCKET.    LOOK FOR  THE ONES THAT ARE 17 NET CARBS OR LESS    Mission makes 2 sizes of  Low carb whole wheat tortillas  (The large one is  210 cal and 6 net carbs)   Avoid "Low fat dressings, as well as Reyne [place] and [address] dressings    3 PM/ Mid day  Snack:  Consider  1 ounce of  almonds, walnuts, pistachios, pecans, peanuts,  Macadamia nuts or a nut medley that does not contain raisins or cranberries.  No "granola"; the dried cranberries and raisins are loaded with carbohydrates. Mixed nuts as long as there are no raisins,  cranberries or dried fruit.    Try the prosciutto/mozzarella cheese sticks by [employer]  In deli /backery section   High protein   To avoid overindulging in snacks: Try drinking a glass of unsweeted almond/coconut milk  Or a cup of coffee with your Atkins chocolate bar  to keep you from having 3!!!   Pork rinds!  Yes Pork Rinds are low carb potato chip substitute!   Toasted Joseph's flatbread with hummous dip (chickpeas)       6 PM  Dinner:     Meat/fowl/fish with a green salad, and either broccoli, cauliflower, green beans, spinach, brussel sprouts, bok choy or  Lima beans. Fried in canola oil /olive oil BUT DO NOT BREAD THE PROTEIN!!      There is a low carb pasta by Dreamfield's that is acceptable and tastes great: only 5 digestible carbs/serving.( All grocery stores but BJs carry it )  Prepared Meals:  Try Kai Levins Angelo's chicken piccata or chicken or eggplant parm over low carb pasta.(Lowes and BJs)   Clifton Custard Sanchez's "Carnitas" (pulled pork, no sauce,  0 carbs) or his beef pot roast to make a dinner burrito (at [employer])  Barbecue with cole slaw is low carb BUT NO BUN!  SAME WITH HAMBURGERS     Whole wheat pasta is still full of digestible carbs and  Not as low in glycemic index as Dreamfield's.    Brown rice is still rice,  So skip the rice and noodles if you eat [place] or [place] (or at least limit to 1/2 cup)  9 PM snack :   Breyer's "low carb" fudgsicle or  ice cream bar (Carb Smart line), or  Weight  Watcher's ice cream bar , or another "no sugar added" ice cream;  a serving of fresh berries/cherries with whipped cream   Cheese or greek yogurt   8 ounces of [place] unsweetened almond/cococunut milk  Cheese and crackers (using WASA crackers,  They are low carb) or peanut butter on low carb crackers or pita bread     Avoid bananas, pineapple, grapes  and watermelon on a regular basis because they are high in sugar.  THINK OF THEM AS DESSERT and do not have daily   Remember that snack Substitutions should be less than 10 NET carbs per serving and meals should be < 20 net carbs. Remember that carbohydrates from fiber do not affect blood sugar, so you can  subtract fiber grams to get the "net carbs " of any particular food item.

## 2015-01-17 NOTE — Assessment & Plan Note (Signed)
Well controlled on current regimen. Renal function stable, no changes today.  Lab Results  Component Value Date   CREATININE 1.48 01/15/2015   Lab Results  Component Value Date   NA 131* 01/15/2015   K 4.8 01/15/2015   CL 97 01/15/2015   CO2 24 01/15/2015

## 2015-01-20 ENCOUNTER — Encounter: Payer: Self-pay | Admitting: Internal Medicine

## 2015-01-20 NOTE — Assessment & Plan Note (Addendum)
At time of last visit his DM was controlled on low GI diet .  Currently loss of control with glucosuria, pseuohyponatremia and hperosnolar crisis now resolved. Continue glipizide and metformin and follow BS for next two weeks on low GI diet.  Will add NPH if needed. .  Lab Results  Component Value Date   HGBA1C 12.2* 01/15/2015   Lab Results  Component Value Date   MICROALBUR 16.6* 01/15/2015

## 2015-01-22 ENCOUNTER — Other Ambulatory Visit (INDEPENDENT_AMBULATORY_CARE_PROVIDER_SITE_OTHER): Payer: Medicare Other

## 2015-01-22 DIAGNOSIS — R3589 Other polyuria: Secondary | ICD-10-CM

## 2015-01-22 DIAGNOSIS — IMO0002 Reserved for concepts with insufficient information to code with codable children: Secondary | ICD-10-CM

## 2015-01-22 DIAGNOSIS — R358 Other polyuria: Secondary | ICD-10-CM

## 2015-01-22 DIAGNOSIS — E1139 Type 2 diabetes mellitus with other diabetic ophthalmic complication: Secondary | ICD-10-CM

## 2015-01-22 DIAGNOSIS — E1165 Type 2 diabetes mellitus with hyperglycemia: Secondary | ICD-10-CM

## 2015-01-22 LAB — COMPREHENSIVE METABOLIC PANEL
ALBUMIN: 4.3 g/dL (ref 3.5–5.2)
ALT: 25 U/L (ref 0–53)
AST: 30 U/L (ref 0–37)
Alkaline Phosphatase: 103 U/L (ref 39–117)
BUN: 28 mg/dL — ABNORMAL HIGH (ref 6–23)
CALCIUM: 9.5 mg/dL (ref 8.4–10.5)
CHLORIDE: 106 meq/L (ref 96–112)
CO2: 23 mEq/L (ref 19–32)
Creatinine, Ser: 1.56 mg/dL — ABNORMAL HIGH (ref 0.40–1.50)
GFR: 47.26 mL/min — AB (ref >60.00)
Glucose, Bld: 76 mg/dL (ref 70–99)
POTASSIUM: 4.6 meq/L (ref 3.5–5.1)
Sodium: 137 mEq/L (ref 135–145)
TOTAL PROTEIN: 6.8 g/dL (ref 6.0–8.3)
Total Bilirubin: 0.7 mg/dL (ref 0.2–1.2)

## 2015-01-22 LAB — POCT URINALYSIS DIPSTICK
Blood, UA: NEGATIVE
Glucose, UA: NEGATIVE
Leukocytes, UA: NEGATIVE
Nitrite, UA: NEGATIVE
PH UA: 5.5
PROTEIN UA: 100
Urobilinogen, UA: 0.2

## 2015-01-23 ENCOUNTER — Encounter: Payer: Self-pay | Admitting: Internal Medicine

## 2015-01-23 ENCOUNTER — Ambulatory Visit: Payer: Self-pay | Admitting: Internal Medicine

## 2015-02-08 NOTE — Telephone Encounter (Signed)
Mailed Patient his unread My Chart Message on 02/08/2015.

## 2015-02-18 ENCOUNTER — Other Ambulatory Visit: Payer: Self-pay | Admitting: Internal Medicine

## 2015-02-18 ENCOUNTER — Other Ambulatory Visit: Payer: Self-pay | Admitting: *Deleted

## 2015-02-18 MED ORDER — METFORMIN HCL 1000 MG PO TABS: 1000.0000 mg | ORAL_TABLET | Freq: Two times a day (BID) | ORAL | Status: DC

## 2015-02-18 MED ORDER — GABAPENTIN 300 MG PO CAPS: 600.0000 mg | ORAL_CAPSULE | Freq: Three times a day (TID) | ORAL | Status: DC

## 2015-04-10 ENCOUNTER — Other Ambulatory Visit: Payer: Self-pay | Admitting: Internal Medicine

## 2015-04-11 NOTE — Telephone Encounter (Signed)
Last OV 3.17.16, no future OV scheduled. Please advise refill.

## 2015-04-11 NOTE — Telephone Encounter (Signed)
90 day supply authorized.

## 2015-06-14 ENCOUNTER — Other Ambulatory Visit: Payer: Self-pay | Admitting: Internal Medicine

## 2015-06-17 ENCOUNTER — Other Ambulatory Visit: Payer: Self-pay

## 2015-06-18 ENCOUNTER — Other Ambulatory Visit: Payer: Self-pay | Admitting: Internal Medicine

## 2015-06-19 NOTE — Telephone Encounter (Signed)
Last OV 3.17.16, last refill 4.19.16.  Please advise refill

## 2015-06-21 ENCOUNTER — Other Ambulatory Visit: Payer: Self-pay | Admitting: Internal Medicine

## 2015-06-21 NOTE — Telephone Encounter (Signed)
Pt needs a refill on this medication.

## 2015-06-24 ENCOUNTER — Other Ambulatory Visit: Payer: Self-pay | Admitting: Internal Medicine

## 2015-06-24 NOTE — Telephone Encounter (Signed)
Last refill.  patient needs to have new MD fill going forward since he is no longer local

## 2015-07-17 ENCOUNTER — Other Ambulatory Visit: Payer: Self-pay | Admitting: Internal Medicine

## 2015-07-17 NOTE — Telephone Encounter (Signed)
Last OV 3.17.16.  Please advise refill 

## 2015-07-18 NOTE — Telephone Encounter (Signed)
Refill for 30 days only.  OFFICE VISIT NEEDED prior to any more refills 

## 2015-07-18 NOTE — Telephone Encounter (Signed)
Left message on VM to return call to schedule appoint.  Refill faxed

## 2015-08-16 ENCOUNTER — Other Ambulatory Visit: Payer: Self-pay | Admitting: Internal Medicine

## 2015-08-16 NOTE — Telephone Encounter (Signed)
90 day supply authorized and sent  But no more refills after this since he has moved away

## 2015-08-16 NOTE — Telephone Encounter (Signed)
Last OV 3/16 ok to fill? 

## 2015-08-16 NOTE — Telephone Encounter (Signed)
Last office visit was 01/17/15, please advise refill?

## 2015-09-09 ENCOUNTER — Other Ambulatory Visit: Payer: Self-pay | Admitting: Internal Medicine

## 2015-09-10 NOTE — Telephone Encounter (Signed)
Refill denied.  He should have established care at is new residence by now ; 7 months sin e he left locale

## 2015-09-10 NOTE — Telephone Encounter (Signed)
Refill on Tramadol  

## 2015-11-26 ENCOUNTER — Other Ambulatory Visit: Payer: Self-pay | Admitting: Internal Medicine

## 2016-02-05 ENCOUNTER — Other Ambulatory Visit: Payer: Self-pay | Admitting: Internal Medicine

## 2017-01-22 ENCOUNTER — Other Ambulatory Visit: Payer: Self-pay | Admitting: Internal Medicine

## 2017-01-22 NOTE — Telephone Encounter (Addendum)
Last office visit 01/17/15 Last refill 10/25/16 No office visit scheduled
